# Patient Record
Sex: Female | Born: 1965 | Race: White | Hispanic: No | Marital: Married | Smoking: Former smoker
Health system: Southern US, Community
[De-identification: ages and names within clinical notes are randomized; demographics above are authoritative.]

## PROBLEM LIST (undated history)

## (undated) DIAGNOSIS — E782 Mixed hyperlipidemia: Secondary | ICD-10-CM

## (undated) DIAGNOSIS — N390 Urinary tract infection, site not specified: Secondary | ICD-10-CM

## (undated) DIAGNOSIS — Z9689 Presence of other specified functional implants: Secondary | ICD-10-CM

## (undated) DIAGNOSIS — G894 Chronic pain syndrome: Secondary | ICD-10-CM

## (undated) DIAGNOSIS — Z8709 Personal history of other diseases of the respiratory system: Secondary | ICD-10-CM

## (undated) DIAGNOSIS — J4520 Mild intermittent asthma, uncomplicated: Secondary | ICD-10-CM

## (undated) DIAGNOSIS — G8929 Other chronic pain: Secondary | ICD-10-CM

## (undated) DIAGNOSIS — E785 Hyperlipidemia, unspecified: Secondary | ICD-10-CM

## (undated) DIAGNOSIS — N3010 Interstitial cystitis (chronic) without hematuria: Secondary | ICD-10-CM

## (undated) DIAGNOSIS — F419 Anxiety disorder, unspecified: Secondary | ICD-10-CM

## (undated) DIAGNOSIS — R222 Localized swelling, mass and lump, trunk: Secondary | ICD-10-CM

## (undated) DIAGNOSIS — Z8719 Personal history of other diseases of the digestive system: Secondary | ICD-10-CM

## (undated) DIAGNOSIS — I10 Essential (primary) hypertension: Secondary | ICD-10-CM

## (undated) DIAGNOSIS — K219 Gastro-esophageal reflux disease without esophagitis: Secondary | ICD-10-CM

## (undated) DIAGNOSIS — Z8711 Personal history of peptic ulcer disease: Secondary | ICD-10-CM

## (undated) DIAGNOSIS — E7801 Familial hypercholesterolemia: Secondary | ICD-10-CM

## (undated) DIAGNOSIS — M1990 Unspecified osteoarthritis, unspecified site: Secondary | ICD-10-CM

## (undated) DIAGNOSIS — J45909 Unspecified asthma, uncomplicated: Secondary | ICD-10-CM

## (undated) DIAGNOSIS — I2510 Atherosclerotic heart disease of native coronary artery without angina pectoris: Secondary | ICD-10-CM

## (undated) HISTORY — DX: Essential (primary) hypertension: I10

## (undated) HISTORY — DX: Anxiety disorder, unspecified: F41.9

## (undated) HISTORY — PX: HERNIA REPAIR: SHX51

## (undated) HISTORY — PX: CARPAL TUNNEL RELEASE: SHX101

## (undated) HISTORY — DX: Familial hypercholesterolemia: E78.01

## (undated) HISTORY — PX: SEPTOPLASTY: SUR1290

## (undated) HISTORY — PX: SPINAL CORD STIMULATOR INSERTION: SHX5378

## (undated) HISTORY — DX: Urinary tract infection, site not specified: N39.0

## (undated) HISTORY — PX: ESOPHAGOGASTRODUODENOSCOPY: SHX1529

## (undated) HISTORY — DX: Atherosclerotic heart disease of native coronary artery without angina pectoris: I25.10

## (undated) HISTORY — PX: ANTERIOR FUSION CERVICAL SPINE: SUR626

## (undated) HISTORY — PX: BREAST LUMPECTOMY: SHX2

## (undated) HISTORY — PX: HIP FRACTURE SURGERY: SHX118

## (undated) HISTORY — PX: CYSTOSCOPY WITH HYDRODISTENSION AND BIOPSY: SHX5127

## (undated) HISTORY — PX: COLONOSCOPY: SHX174

## (undated) HISTORY — PX: VOCAL CORD INJECTION: SHX2663

## (undated) HISTORY — DX: Hyperlipidemia, unspecified: E78.5

## (undated) HISTORY — PX: VAGINAL HYSTERECTOMY: SUR661

## (undated) HISTORY — PX: LUMBAR SPINE SURGERY: SHX701

## (undated) HISTORY — DX: Unspecified asthma, uncomplicated: J45.909

## (undated) HISTORY — DX: Interstitial cystitis (chronic) without hematuria: N30.10

## (undated) HISTORY — DX: Unspecified osteoarthritis, unspecified site: M19.90

---

## 1984-08-06 HISTORY — PX: SEPTOPLASTY: SUR1290

## 1987-08-07 HISTORY — PX: VAGINAL HYSTERECTOMY: SUR661

## 1989-08-06 HISTORY — PX: HERNIA REPAIR: SHX51

## 1995-08-07 HISTORY — PX: BREAST LUMPECTOMY: SHX2

## 1996-08-06 HISTORY — PX: LUMBAR FUSION: SHX111

## 1996-08-06 HISTORY — PX: HIP SURGERY: SHX245

## 1999-08-07 HISTORY — PX: CARPAL TUNNEL RELEASE: SHX101

## 2017-08-06 HISTORY — PX: SPINAL CORD STIMULATOR BATTERY EXCHANGE: SHX6202

## 2018-05-16 ENCOUNTER — Encounter (INDEPENDENT_AMBULATORY_CARE_PROVIDER_SITE_OTHER): Payer: BLUE CROSS/BLUE SHIELD | Admitting: Neurology

## 2018-05-16 ENCOUNTER — Ambulatory Visit (INDEPENDENT_AMBULATORY_CARE_PROVIDER_SITE_OTHER): Payer: BLUE CROSS/BLUE SHIELD | Admitting: Neurology

## 2018-05-16 DIAGNOSIS — G5601 Carpal tunnel syndrome, right upper limb: Secondary | ICD-10-CM | POA: Diagnosis not present

## 2018-05-16 DIAGNOSIS — Z0289 Encounter for other administrative examinations: Secondary | ICD-10-CM

## 2018-05-16 NOTE — Procedures (Signed)
Full Name: Alice Ray Gender: Female MRN #: [id number] Date of Birth: 02-23-1966    Visit Date: 05/16/2018 10:53 Age: 52 Years 70 Months Old Examining Physician: Marcial Pacas, MD  Referring Physician: Vivi Ferns, MD History: 52 year old female, with history of right carpal tunnel release surgery, cervical fusion surgery in the past, presenting with few months history of bilateral hand deep achy pain, especially bilateral palm area, right worse than left.  Summary of the tests:  Nerve conduction study: Bilateral ulnar, median sensory responses were normal.  Right median mixed response was 0.6 ms prolonged in comparison to ipsilateral ulnar mixed response.  Bilateral ulnar and median motor responses were normal.  Electromyography: Selective needle examination of right upper extremity and right cervical paraspinal muscles were normal.  Conclusion: This is an abnormal study.  There is electrodiagnostic evidence of mild median neuropathy across the wrist consistent with mild right carpal tunnel syndrome.  There is no evidence of right cervical radiculopathy.    ------------------------------- Marcial Pacas, M.D. PhD.  [facility] [place], [place] [address] Tel: [phone] Fax: [phone]        [facility]    Nerve / Sites Muscle Latency Ref. Amplitude Ref. Rel Amp Segments Distance Velocity Ref. Area    ms ms mV mV %  cm m/s m/s mVms  R Median - APB     Wrist APB 3.1 ?4.4 4.4 ?4.0 100 Wrist - APB 7   15.8     Upper arm APB 7.2  4.4  101 Upper arm - Wrist 23 57 ?49 16.1  L Median - APB     Wrist APB 3.4 ?4.4 7.4 ?4.0 100 Wrist - APB 7   33.0     Upper arm APB 7.6  6.8  91.7 Upper arm - Wrist 23 55 ?49 30.3  R Ulnar - ADM     Wrist ADM 2.6 ?3.3 9.1 ?6.0 100 Wrist - ADM 7   26.0     B.Elbow ADM 5.9  8.5  93.3 B.Elbow - Wrist 20 59 ?49 25.7     A.Elbow ADM 7.8  8.4  99.4 A.Elbow - B.Elbow 10 55 ?49 24.9         A.Elbow - Wrist      L Ulnar  - ADM     Wrist ADM 2.5 ?3.3 9.5 ?6.0 100 Wrist - ADM 7   28.2     B.Elbow ADM 5.9  9.1  96 B.Elbow - Wrist 20 58 ?49 27.9     A.Elbow ADM 7.7  9.0  98.9 A.Elbow - B.Elbow 10 58 ?49 28.0         A.Elbow - Wrist                 SNC    Nerve / Sites Rec. Site Peak Lat Ref.  Amp Ref. Segments Distance Peak Diff Ref.    ms ms V V  cm ms ms  R Median, Ulnar - Transcarpal comparison     Median Palm Wrist 2.4 ?2.2 26 ?35 Median Palm - Wrist 8       Ulnar Palm Wrist 1.8 ?2.2 16 ?12 Ulnar Palm - Wrist 8          Median Palm - Ulnar Palm  0.6 ?0.4  R Median - Orthodromic (Dig II, Mid palm)     Dig II Wrist 3.0 ?3.4 15 ?10 Dig II - Wrist 13    L Median - Orthodromic (  Dig II, Mid palm)     Dig II Wrist 3.4 ?3.4 13 ?10 Dig II - Wrist 13    R Ulnar - Orthodromic, (Dig V, Mid palm)     Dig V Wrist 2.6 ?3.1 7 ?5 Dig V - Wrist 11    L Ulnar - Orthodromic, (Dig V, Mid palm)     Dig V Wrist 2.5 ?3.1 6 ?5 Dig V - Wrist 51                 F  Wave    Nerve F Lat Ref.   ms ms  R Ulnar - ADM 28.8 ?32.0  L Ulnar - ADM 28.1 ?32.0         EMG full       EMG Summary Table    Spontaneous MUAP Recruitment  Muscle IA Fib PSW Fasc Other Amp Dur. Poly Pattern  R. First dorsal interosseous Normal None None None _______ Normal Normal Normal Normal  R. Pronator teres Normal None None None _______ Normal Normal Normal Normal  R. Biceps brachii Normal None None None _______ Normal Normal Normal Normal  R. Deltoid Normal None None None _______ Normal Normal Normal Normal  R. Triceps brachii Normal None None None _______ Normal Normal Normal Normal  R. Extensor digitorum communis Normal None None None _______ Normal Normal Normal Normal

## 2019-09-13 ENCOUNTER — Encounter (HOSPITAL_COMMUNITY): Payer: Self-pay

## 2019-09-13 ENCOUNTER — Emergency Department (HOSPITAL_COMMUNITY): Payer: BC Managed Care – PPO

## 2019-09-13 ENCOUNTER — Other Ambulatory Visit: Payer: Self-pay

## 2019-09-13 ENCOUNTER — Emergency Department (HOSPITAL_COMMUNITY)
Admission: EM | Admit: 2019-09-13 | Discharge: 2019-09-13 | Disposition: A | Discharge status: Home / Self Care | Payer: BC Managed Care – PPO | Attending: Emergency Medicine | Admitting: Emergency Medicine

## 2019-09-13 DIAGNOSIS — K625 Hemorrhage of anus and rectum: Secondary | ICD-10-CM | POA: Diagnosis present

## 2019-09-13 DIAGNOSIS — K529 Noninfective gastroenteritis and colitis, unspecified: Secondary | ICD-10-CM | POA: Diagnosis not present

## 2019-09-13 LAB — CBC WITH DIFFERENTIAL/PLATELET
Abs Immature Granulocytes: 0.02 10*3/uL (ref 0.00–0.07)
Basophils Absolute: 0 10*3/uL (ref 0.0–0.1)
Basophils Relative: 1 %
Eosinophils Absolute: 0.1 10*3/uL (ref 0.0–0.5)
Eosinophils Relative: 1 %
HCT: 42.8 % (ref 36.0–46.0)
Hemoglobin: 14 g/dL (ref 12.0–15.0)
Immature Granulocytes: 0 %
Lymphocytes Relative: 23 %
Lymphs Abs: 1.3 10*3/uL (ref 0.7–4.0)
MCH: 31.2 pg (ref 26.0–34.0)
MCHC: 32.7 g/dL (ref 30.0–36.0)
MCV: 95.3 fL (ref 80.0–100.0)
Monocytes Absolute: 0.4 10*3/uL (ref 0.1–1.0)
Monocytes Relative: 7 %
Neutro Abs: 4.1 10*3/uL (ref 1.7–7.7)
Neutrophils Relative %: 68 %
Platelets: 252 10*3/uL (ref 150–400)
RBC: 4.49 MIL/uL (ref 3.87–5.11)
RDW: 13.3 % (ref 11.5–15.5)
WBC: 5.9 10*3/uL (ref 4.0–10.5)
nRBC: 0 % (ref 0.0–0.2)

## 2019-09-13 LAB — COMPREHENSIVE METABOLIC PANEL
ALT: 25 U/L (ref 0–44)
AST: 22 U/L (ref 15–41)
Albumin: 4.6 g/dL (ref 3.5–5.0)
Alkaline Phosphatase: 53 U/L (ref 38–126)
Anion gap: 9 (ref 5–15)
BUN: 10 mg/dL (ref 6–20)
CO2: 28 mmol/L (ref 22–32)
Calcium: 9.5 mg/dL (ref 8.9–10.3)
Chloride: 102 mmol/L (ref 98–111)
Creatinine, Ser: 0.7 mg/dL (ref 0.44–1.00)
GFR calc Af Amer: >60 mL/min (ref >60)
GFR calc non Af Amer: >60 mL/min (ref >60)
Glucose, Bld: 102 mg/dL — ABNORMAL HIGH (ref 70–99)
Potassium: 3.5 mmol/L (ref 3.5–5.1)
Sodium: 139 mmol/L (ref 135–145)
Total Bilirubin: 0.7 mg/dL (ref 0.3–1.2)
Total Protein: 7.3 g/dL (ref 6.5–8.1)

## 2019-09-13 LAB — LIPASE, BLOOD: Lipase: 21 U/L (ref 11–51)

## 2019-09-13 LAB — POC OCCULT BLOOD, ED: Fecal Occult Bld: POSITIVE — AB

## 2019-09-13 LAB — PROTIME-INR
INR: 1 (ref 0.8–1.2)
Prothrombin Time: 12.6 seconds (ref 11.4–15.2)

## 2019-09-13 MED ORDER — METRONIDAZOLE 500 MG PO TABS: 500.0000 mg | ORAL_TABLET | Freq: Two times a day (BID) | ORAL | 0 refills | Status: DC

## 2019-09-13 MED ORDER — IOHEXOL 350 MG/ML SOLN
100.0000 mL | Freq: Once | INTRAVENOUS | Status: AC | PRN
  Administered 2019-09-13: 100 mL via INTRAVENOUS

## 2019-09-13 MED ORDER — SODIUM CHLORIDE (PF) 0.9 % IJ SOLN
INTRAMUSCULAR | Status: AC
  Filled 2019-09-13: qty 50

## 2019-09-13 MED ORDER — SODIUM CHLORIDE 0.9 % IV BOLUS
1000.0000 mL | Freq: Once | INTRAVENOUS | Status: AC
  Administered 2019-09-13: 1000 mL via INTRAVENOUS

## 2019-09-13 MED ORDER — METRONIDAZOLE 500 MG PO TABS
500.0000 mg | ORAL_TABLET | Freq: Once | ORAL | Status: AC
  Administered 2019-09-13: 500 mg via ORAL
  Filled 2019-09-13: qty 1

## 2019-09-13 MED ORDER — OXYCODONE-ACETAMINOPHEN 5-325 MG PO TABS
2.0000 | ORAL_TABLET | Freq: Once | ORAL | Status: AC
  Administered 2019-09-13: 2 via ORAL
  Filled 2019-09-13: qty 2

## 2019-09-13 MED ORDER — CIPROFLOXACIN HCL 500 MG PO TABS
500.0000 mg | ORAL_TABLET | Freq: Once | ORAL | Status: AC
  Administered 2019-09-13: 500 mg via ORAL
  Filled 2019-09-13: qty 1

## 2019-09-13 MED ORDER — CIPROFLOXACIN HCL 500 MG PO TABS: 500.0000 mg | ORAL_TABLET | Freq: Two times a day (BID) | ORAL | 0 refills | Status: DC

## 2019-09-13 MED ORDER — MORPHINE SULFATE (PF) 4 MG/ML IV SOLN
4.0000 mg | Freq: Once | INTRAVENOUS | Status: AC
  Administered 2019-09-13: 4 mg via INTRAVENOUS
  Filled 2019-09-13: qty 1

## 2019-09-13 NOTE — ED Provider Notes (Signed)
[facility] DEPT Provider Note   CSN: [id number] Arrival date & time: 09/13/19  1512     History Chief Complaint  Patient presents with  . Rectal Bleeding    Alice Ray is a 54 y.o. female.  She has no significant past medical history.  She is complaining of acute onset at 2 AM of some abdominal discomfort and she went to the toilet where she had an explosive stool.  She had a syncopal event after that.  She is not sure if there was any bleeding related to that but since this morning she has been passing multiple episodes of bright red blood per rectum.  Associated with some lower abdominal pain.  No fevers or chills no nausea no vomiting.  She is very diaphoretic prior to the syncopal event.  No sick contacts or recent travel.  Works on a farm.  She said she has had some GI issues in the past that they thought was related to Salmonella although has not had any problems in the last few months and it was not involving any bleeding.  Had a normal colonoscopy in the past.  The history is provided by the patient.  Rectal Bleeding Quality:  Bright red Amount:  Moderate Duration:  12 hours Timing:  Intermittent Chronicity:  New Context: defecation and spontaneously   Similar prior episodes: no   Relieved by:  Nothing Worsened by:  Defecation Ineffective treatments:  None tried Associated symptoms: abdominal pain and loss of consciousness   Associated symptoms: no fever, no hematemesis and no vomiting   Risk factors: no anticoagulant use        History reviewed. No pertinent past medical history.  There are no problems to display for this patient.   History reviewed. No pertinent surgical history.   OB History   No obstetric history on file.     No family history on file.  Social History   Tobacco Use  . Smoking status: Never Smoker  . Smokeless tobacco: Never Used  Substance Use Topics  . Alcohol use: Never  . Drug use: Never     Home Medications Prior to Admission medications   Not on File    Allergies    Patient has no known allergies.  Review of Systems   Review of Systems  Constitutional: Negative for fever.  HENT: Negative for sore throat.   Eyes: Negative for visual disturbance.  Respiratory: Negative for shortness of breath.   Cardiovascular: Negative for chest pain.  Gastrointestinal: Positive for abdominal pain and hematochezia. Negative for hematemesis and vomiting.  Genitourinary: Negative for dysuria.  Musculoskeletal: Negative for neck pain.  Skin: Negative for rash.  Neurological: Positive for loss of consciousness and syncope.    Physical Exam Updated Vital Signs BP (!) 131/96 (BP Location: Right Arm)   Pulse (!) 109   Temp 98.3 F (36.8 C) (Oral)   Resp 18   SpO2 98%   Physical Exam Vitals and nursing note reviewed.  Constitutional:      General: She is not in acute distress.    Appearance: She is well-developed.  HENT:     Head: Normocephalic and atraumatic.  Eyes:     Conjunctiva/sclera: Conjunctivae normal.  Cardiovascular:     Rate and Rhythm: Regular rhythm. Tachycardia present.     Pulses: Normal pulses.     Heart sounds: No murmur.  Pulmonary:     Effort: Pulmonary effort is normal. No respiratory distress.     Breath  sounds: Normal breath sounds.  Abdominal:     Palpations: Abdomen is soft.     Tenderness: There is no abdominal tenderness. There is no guarding or rebound.  Musculoskeletal:        General: No deformity or signs of injury. Normal range of motion.     Cervical back: Neck supple.  Skin:    General: Skin is warm and dry.     Capillary Refill: Capillary refill takes less than 2 seconds.  Neurological:     General: No focal deficit present.     Mental Status: She is alert.     ED Results / Procedures / Treatments   Labs (all labs ordered are listed, but only abnormal results are displayed) Labs Reviewed  COMPREHENSIVE METABOLIC PANEL -  Abnormal; Notable for the following components:      Result Value   Glucose, Bld 102 (*)    All other components within normal limits  POC OCCULT BLOOD, ED - Abnormal; Notable for the following components:   Fecal Occult Bld POSITIVE (*)    All other components within normal limits  GI PATHOGEN PANEL BY PCR, STOOL  CBC WITH DIFFERENTIAL/PLATELET  PROTIME-INR  LIPASE, BLOOD  TYPE AND SCREEN  ABO/RH    EKG None  Radiology CT Angio Abd/Pel W and/or Wo Contrast  Result Date: 09/13/2019 CLINICAL DATA:  54 year old with diarrhea at approximately 2 o'clock a.m. this morning, with subsequent bright red blood per rectum since that time. Personal history of salmonella colitis. EXAM: CTA ABDOMEN AND PELVIS WITHOUT AND WITH CONTRAST TECHNIQUE: Initially, multidetector CT of the abdomen and pelvis was performed prior to IV contrast administration. Multidetector CT imaging of the abdomen and pelvis was then performed using the standard protocol during bolus administration of intravenous contrast. Multiplanar reconstructed images and MIPs were obtained and reviewed to evaluate the vascular anatomy. CONTRAST:  167mL OMNIPAQUE IOHEXOL 350 MG/ML IV. COMPARISON:  None. FINDINGS: VASCULAR Aorta: Mild to moderate atherosclerosis. No evidence of aortic aneurysm. Celiac: Widely patent. No visible atherosclerosis. SMA: Widely patent. No visible atherosclerosis. Renals: Single RIGHT renal artery, widely patent, with minimal atherosclerosis at its origin. Two LEFT renal arteries, including an accessory LOWER pole LEFT renal artery which arises from the ANTERIOR aorta distally, both widely patent without significant atherosclerosis. IMA: Patent with atherosclerosis at its origin and possible origin stenosis. Inflow: Moderate atherosclerosis involving the common iliac arteries and mild atherosclerosis involving the external iliac arteries without evidence of significant stenosis. Proximal Outflow: Mild atherosclerosis  involving the superficial femoral arteries bilaterally without evidence of significant stenosis. Veins: Normal-appearing portal venous and systemic venous systems. Review of the MIP images confirms the above findings. NON-VASCULAR Lower chest: Visualized lung bases clear. Upper normal heart size. Hepatobiliary: Prominent Reidel lobe. Borderline hepatomegaly. No focal hepatic parenchymal abnormalities. Gallbladder normal in appearance without calcified gallstones. No biliary ductal dilation. Pancreas: Normal in appearance without evidence of mass, ductal dilation, or inflammation. Spleen: Normal in size and appearance. Adrenals/Urinary Tract: Normal appearing adrenal glands. Kidneys normal in size and appearance without focal parenchymal abnormality. No hydronephrosis. No evidence of urinary tract calculi. Normal appearing urinary bladder. Stomach/Bowel: Stomach normal in appearance for the degree of distention. Normal-appearing small bowel. Wall thickening/edema involving the descending colon. Remainder of the colon normal in appearance. Appendix not conspicuous, but no pericecal inflammation. Lymphatic: No pathologic lymphadenopathy. Reproductive: Uterus presumed surgically absent. No adnexal masses. Other: Numerous pelvic phleboliths. Battery generator packs for what I presume are dorsal cord stimulating devices are present in the  buttocks. Musculoskeletal: Prior posterior decompression and ray cage fusion at L5-S1. No acute findings. IMPRESSION: VASCULAR 1. No evidence of abdominal aortic aneurysm or dissection. 2. 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 The celiac and SMA are widely patent. The IMA is patent with possible origin stenosis. Therefore, no evidence of large vessel mesenteric ischemia. 3. 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 4. Single RIGHT renal artery and 2 LEFT renal arteries which are widely patent. NON-VASCULAR 1. Wall  thickening/edema involving the descending colon indicating colitis. 2. No acute abnormalities otherwise involving the abdomen or pelvis. Electronically Signed   By: Evangeline Dakin M.D.   On: 09/13/2019 20:36    Procedures Procedures (including critical care time)  Medications Ordered in ED Medications  sodium chloride 0.9 % bolus 1,000 mL (0 mLs Intravenous Stopped 09/13/19 1954)  morphine 4 MG/ML injection 4 mg (4 mg Intravenous Given 09/13/19 1805)  iohexol (OMNIPAQUE) 350 MG/ML injection 100 mL (100 mLs Intravenous Contrast Given 09/13/19 1940)  ciprofloxacin (CIPRO) tablet 500 mg (500 mg Oral Given 09/13/19 2130)  metroNIDAZOLE (FLAGYL) tablet 500 mg (500 mg Oral Given 09/13/19 2130)  oxyCODONE-acetaminophen (PERCOCET/ROXICET) 5-325 MG per tablet 2 tablet (2 tablets Oral Given 09/13/19 2129)    ED Course  I have reviewed the triage vital signs and the nursing notes.  Pertinent labs & imaging results that were available during my care of the patient were reviewed by me and considered in my medical decision making (see chart for details).  Clinical Course as of Sep 13 841  Sun Sep 13, 2019  1757 Differential includes diverticular bleed, diverticulitis, AVM, infectious diarrhea.   [MB]  [id number] Rectal exam done with Wells Guiles nurse as chaperone.  Small anal tag no masses normal tone.  Sample sent to lab for guaiac.   [MB]  2119 Discussed with Dr. Tarri Glenn [facility].  She is recommending the patient go on Cipro and Flagyl and can follow-up with them in the office.  I reviewed this with the patient and she is comfortable with plan.  He is asking for another dose of some pain medicine here but does not feel like she will need it going home.  She did not provide a stool sample unfortunately.   [MB]    Clinical Course User Index [MB] Hayden Rasmussen, MD   MDM Rules/Calculators/A&P                       Final Clinical Impression(s) / ED Diagnoses Final diagnoses:  Rectal bleeding    Colitis    Rx / DC Orders ED Discharge Orders         Ordered    ciprofloxacin (CIPRO) 500 MG tablet  2 times daily     09/13/19 2122    metroNIDAZOLE (FLAGYL) 500 MG tablet  2 times daily     09/13/19 2122           Hayden Rasmussen, MD 09/14/19 [phone]

## 2019-09-13 NOTE — ED Triage Notes (Signed)
Pt presents with c/o rectal bleeding. Pt reports that she has a hx of some GI issues and a hx of salmonella. Pt reports that she has had no issues and then woke up around 2 am and had an episode of diarrhea. Pt reports that since then, she has been passing blood from her rectum. 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 Pt reports that she did have a vasovagal episode with the diarrhea and passed out, waking up on the floor. Pt denies any injury from that fall. Pt reports she must have hit her head because she did pass out but notes no injuries to her head. Pt is alert and oriented at this time.

## 2019-09-13 NOTE — Discharge Instructions (Addendum)
You were evaluated in the emergency department for abdominal pain and rectal bleeding.  Your blood counts were unremarkable.  Your CAT scan showed some thickening of your colon which is called colitis.  We reviewed your findings with gastroenterology and they are recommending to antibiotics and follow-up with them in the clinic.  Please return to the emergency department if any worsening bleeding or other concerning symptoms.

## 2019-09-13 NOTE — ED Notes (Signed)
An After Visit Summary was printed and given to the patient. Discharge instructions given and no further questions at this time. Pt states her husband is driving her home.

## 2019-09-14 LAB — ABO/RH: ABO/RH(D): O POS

## 2019-09-14 LAB — TYPE AND SCREEN
ABO/RH(D): O POS
Antibody Screen: NEGATIVE

## 2019-09-18 ENCOUNTER — Other Ambulatory Visit (INDEPENDENT_AMBULATORY_CARE_PROVIDER_SITE_OTHER): Payer: BC Managed Care – PPO

## 2019-09-18 ENCOUNTER — Ambulatory Visit (INDEPENDENT_AMBULATORY_CARE_PROVIDER_SITE_OTHER): Payer: BC Managed Care – PPO | Admitting: Physician Assistant

## 2019-09-18 ENCOUNTER — Encounter: Payer: Self-pay | Admitting: Physician Assistant

## 2019-09-18 ENCOUNTER — Other Ambulatory Visit: Payer: Self-pay

## 2019-09-18 VITALS — BP 108/66 | HR 64 | Temp 97.6°F | Ht 68.5 in | Wt 146.0 lb

## 2019-09-18 DIAGNOSIS — R197 Diarrhea, unspecified: Secondary | ICD-10-CM

## 2019-09-18 DIAGNOSIS — R9389 Abnormal findings on diagnostic imaging of other specified body structures: Secondary | ICD-10-CM | POA: Diagnosis not present

## 2019-09-18 DIAGNOSIS — R1084 Generalized abdominal pain: Secondary | ICD-10-CM | POA: Diagnosis not present

## 2019-09-18 DIAGNOSIS — K921 Melena: Secondary | ICD-10-CM

## 2019-09-18 DIAGNOSIS — B370 Candidal stomatitis: Secondary | ICD-10-CM

## 2019-09-18 LAB — CBC WITH DIFFERENTIAL/PLATELET
Basophils Absolute: 0 10*3/uL (ref 0.0–0.1)
Basophils Relative: 0.2 % (ref 0.0–3.0)
Eosinophils Absolute: 0.1 10*3/uL (ref 0.0–0.7)
Eosinophils Relative: 1.9 % (ref 0.0–5.0)
HCT: 40.5 % (ref 36.0–46.0)
Hemoglobin: 13.5 g/dL (ref 12.0–15.0)
Lymphocytes Relative: 35.2 % (ref 12.0–46.0)
Lymphs Abs: 1.8 10*3/uL (ref 0.7–4.0)
MCHC: 33.3 g/dL (ref 30.0–36.0)
MCV: 92.8 fl (ref 78.0–100.0)
Monocytes Absolute: 0.4 10*3/uL (ref 0.1–1.0)
Monocytes Relative: 8.2 % (ref 3.0–12.0)
Neutro Abs: 2.9 10*3/uL (ref 1.4–7.7)
Neutrophils Relative %: 54.5 % (ref 43.0–77.0)
Platelets: 326 10*3/uL (ref 150.0–400.0)
RBC: 4.36 Mil/uL (ref 3.87–5.11)
RDW: 13.6 % (ref 11.5–15.5)
WBC: 5.2 10*3/uL (ref 4.0–10.5)

## 2019-09-18 LAB — COMPREHENSIVE METABOLIC PANEL
ALT: 21 U/L (ref 0–35)
AST: 20 U/L (ref 0–37)
Albumin: 4.5 g/dL (ref 3.5–5.2)
Alkaline Phosphatase: 58 U/L (ref 39–117)
BUN: 11 mg/dL (ref 6–23)
CO2: 31 mEq/L (ref 19–32)
Calcium: 10.2 mg/dL (ref 8.4–10.5)
Chloride: 101 mEq/L (ref 96–112)
Creatinine, Ser: 0.84 mg/dL (ref 0.40–1.20)
GFR: 70.53 mL/min (ref >60.00)
Glucose, Bld: 97 mg/dL (ref 70–99)
Potassium: 4.5 mEq/L (ref 3.5–5.1)
Sodium: 139 mEq/L (ref 135–145)
Total Bilirubin: 0.4 mg/dL (ref 0.2–1.2)
Total Protein: 7.4 g/dL (ref 6.0–8.3)

## 2019-09-18 MED ORDER — FLUCONAZOLE 100 MG PO TABS: 100.0000 mg | ORAL_TABLET | Freq: Every day | ORAL | 0 refills | Status: AC

## 2019-09-18 NOTE — Patient Instructions (Signed)
If you are age 55 or older, your body mass index should be between 23-30. Your Body mass index is 21.88 kg/m. If this is out of the aforementioned range listed, please consider follow up with your Primary Care Provider.  If you are age 88 or younger, your body mass index should be between 19-25. Your Body mass index is 21.88 kg/m. If this is out of the aformentioned range listed, please consider follow up with your Primary Care Provider.   Your provider has requested that you go to the basement level for lab work before leaving today. Press "B" on the elevator. The lab is located at the first door on the left as you exit the elevator.  We have sent the following medications to your pharmacy for you to pick up at your convenience: Diflucan  Finish antibiotics.  You have been given Low fiber, low residue diet.  We will contact you with lab results.  Thank you for choosing me and [facility].    Ellouise Newer, PA-C  Due to recent changes in healthcare laws, you may see the results of your imaging and laboratory studies on MyChart before your provider has had a chance to review them.  We understand that in some cases there may be results that are confusing or concerning to you. Not all laboratory results come back in the same time frame and the provider may be waiting for multiple results in order to interpret others.  Please give [place] 48 hours in order for your provider to thoroughly review all the results before contacting the office for clarification of your results.

## 2019-09-18 NOTE — Progress Notes (Signed)
Chief Complaint: ER follow-up for rectal bleeding  HPI:    Alice Ray is a 54 year old female with a past medical history as listed below, who was referred to me by Joya Gaskins, * for follow-up after being seen in the ER for rectal bleeding.      09/13/2019 patient seen in the ER for rectal bleeding.  At that time described acute onset at 2 AM of abdominal discomfort and went to the toilet and had an explosive stool with a syncopal episode after that, then passed multiple episodes of bright red blood per rectum associated lower abdominal pain.  Labs at that time with a normal CBC and CMP, stool occult positive.  CT abdomen pelvis with and without contrast showed possible origins stenosis involving the inferior mesenteric artery.  The celiac and SMA were widely patent.  The IMA was patent with possible origin stenosis.  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  Wall thickening/edema involving the descending colon indicating colitis.  Rectal exam done with a small anal tag no masses, normal tone.  It was recommend the patient be started on Cipro and Flagyl.    Today, the patient presents to clinic and explains that she has been training to climb [place] [facility] for the past 4 months and did a 6 mile, 6 mile and then 7 mile training session and then woke up around 2 AM on 09/13/2019 with horrendous 10/10 abdominal pain which left her with sweat dripping off of her face by the time she got to her toilet which is located in her bedroom, she then passed out and started shedding a lot of blood per rectum.  This was bright red blood and happened multiple times before she went to the ER.  While in the ER she did not pass blood but as soon as she came home she passed more blood.  Tells me that since then she has continued with some abdominal pain though this is much better than when it started it seems to come and go and "roller coaster" in intensity.  She just started  having actual bowel movements yesterday with stool and tells me that they were urgent, had at least 5 yesterday and 3 this morning so far, the blood itself has "slowed way down", in fact she has not seeing any obvious bleeding over the past day and a half.  Associated symptoms include nausea.  Does continue on her Flagyl and Cipro.  Overall feels like she is improving.  Associated symptoms include some chills.  Does tell me that she has been continuing on what sounds like a high-fiber diet and asked what she should be eating.    Also tells me that she was having some GI issues about 4 months ago for which she was seen by her PCP including a lot of diarrhea and abdominal cramping but no bleeding.  She was told that this is because she recently moved here from [place] and did not have the same "microbiome" and was just adjusting.    Does tell me she is getting some thrush in her mouth and would like some Diflucan.    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    Medical  history also significant for having what sounds like a spinal stimulator in place to help with pain after falling off a ledge and breaking multiple bones in the past. This is the first of it's kind per the patient, so she is "kinda famous".    Patient sister works as a Marine scientist for what sounds like an IT consultant and asked if she was Covid tested.    Last colonoscopy 2 years ago in [place].  Patient tells me this was normal.    Denies documented fever or weight loss.  Past Medical History:  Diagnosis Date  . Anxiety   . Arthritis   . Asthma   . Hyperlipidemia   . Hypertension   . Interstitial cystitis   . UTI (urinary tract infection)     Past Surgical History:  Procedure  Laterality Date  . ANTERIOR FUSION CERVICAL SPINE    . BREAST LUMPECTOMY Right   . CARPAL TUNNEL RELEASE    . CYSTOSCOPY WITH HYDRODISTENSION AND BIOPSY    . HERNIA REPAIR     Rectus Hernia split repair  . HIP FRACTURE SURGERY    . LUMBAR SPINE SURGERY    . SEPTOPLASTY    . VAGINAL HYSTERECTOMY    . VOCAL CORD INJECTION      Current Outpatient Medications  Medication Sig Dispense Refill  . albuterol (VENTOLIN HFA) 108 (90 Base) MCG/ACT inhaler Inhale 2 puffs into the lungs as needed for wheezing or shortness of breath.    . ciprofloxacin (CIPRO) 500 MG tablet Take 1 tablet (500 mg total) by mouth 2 (two) times daily. 14 tablet 0  . diclofenac Sodium (VOLTAREN) 1 % GEL Apply 1 application topically 4 (four) times daily as needed (joint pain).     Marland Kitchen EPINEPHrine 0.3 mg/0.3 mL IJ SOAJ injection Inject 0.3 mg into the muscle Once PRN for anaphylaxis.    Marland Kitchen LORazepam (ATIVAN) 1 MG tablet Take 1 mg by mouth daily as needed for anxiety.     Marland Kitchen losartan (COZAAR) 100 MG tablet Take 50 mg by mouth daily.    . metoprolol succinate (TOPROL-XL) 25 MG 24 hr tablet Take 25 mg by mouth every evening.    . metroNIDAZOLE (FLAGYL) 500 MG tablet Take 1 tablet (500 mg total) by mouth 2 (two) times daily. 14 tablet 0  . montelukast (SINGULAIR) 10 MG tablet Take 10 mg by mouth daily as needed (allergies, asthma).     . pantoprazole (PROTONIX) 40 MG tablet Take 40 mg by mouth every morning.    . tiaGABine (GABITRIL) 4 MG tablet Take 4 mg by mouth at bedtime.     No current facility-administered medications for this visit.    Allergies as of 09/18/2019 - Review Complete 09/18/2019  Allergen Reaction Noted  . Penicillins Anaphylaxis 05/10/2016    Family History  Problem Relation Age of Onset  . Colitis Father   . Heart disease Father   . Diverticulosis Sister   . Colitis Paternal Aunt     Social History   Socioeconomic History  . Marital status: Married    Spouse name: Not on file  . Number of  children: Not on file  . Years of education: Not on file  . Highest education level: Not on file  Occupational History  . Not on file  Tobacco Use  . Smoking status: Former Research scientist (life sciences)  . Smokeless tobacco: Never Used  Substance and Sexual Activity  . Alcohol use: Never  . Drug use: Never  . Sexual activity:  Not on file  Other Topics Concern  . Not on file  Social History Narrative  . Not on file   Social Determinants of Health   Financial Resource Strain:   . Difficulty of Paying Living Expenses: Not on file  Food Insecurity:   . Worried About Charity fundraiser in the Last Year: Not on file  . Ran Out of Food in the Last Year: Not on file  Transportation Needs:   . Lack of Transportation (Medical): Not on file  . Lack of Transportation (Non-Medical): Not on file  Physical Activity:   . Days of Exercise per Week: Not on file  . Minutes of Exercise per Session: Not on file  Stress:   . Feeling of Stress : Not on file  Social Connections:   . Frequency of Communication with Friends and Family: Not on file  . Frequency of Social Gatherings with Friends and Family: Not on file  . Attends Religious Services: Not on file  . Active Member of Clubs or Organizations: Not on file  . Attends Archivist Meetings: Not on file  . Marital Status: Not on file  Intimate Partner Violence:   . Fear of Current or Ex-Partner: Not on file  . Emotionally Abused: Not on file  . Physically Abused: Not on file  . Sexually Abused: Not on file    Review of Systems:    Constitutional: No weight loss, fever or chills Skin: No rash  Cardiovascular: No chest pain Respiratory: No SOB  Gastrointestinal: See HPI and otherwise negative Genitourinary: No dysuria  Neurological:+syncope Musculoskeletal: No new muscle or joint pain Hematologic: No bruising Psychiatric: No history of depression or anxiety   Physical Exam:  Vital signs: BP 108/66   Pulse 64   Temp 97.6 F (36.4 C)   Ht  5' 8.5" (1.74 m)   Wt 146 lb (66.2 kg)   BMI 21.88 kg/m   Constitutional:   Very Pleasant Caucasian female appears to be in NAD, Well developed, Well nourished, alert and cooperative Head:  Normocephalic and atraumatic. Eyes:   PEERL, EOMI. No icterus. Conjunctiva pink. Ears:  Normal auditory acuity. Neck:  Supple Throat: Oral cavity and pharynx without inflammation, swelling or lesion.  Respiratory: Respirations even and unlabored. Lungs clear to auscultation bilaterally.   No wheezes, crackles, or rhonchi.  Cardiovascular: Normal S1, S2. No MRG. Regular rate and rhythm. No peripheral edema, cyanosis or pallor.  Gastrointestinal:  Soft, nondistended, moderate generalized ttp with some involuntary guarding. Normal bowel sounds. No appreciable masses or hepatomegaly. Rectal:  Not performed.   Msk:  Symmetrical without gross deformities. Without edema, no deformity or joint abnormality.  Neurologic:  Alert and  oriented x4;  grossly normal neurologically.  Skin:   Dry and intact without significant lesions or rashes. Psychiatric: Demonstrates good judgement and reason without abnormal affect or behaviors.  RELEVANT LABS AND IMAGING: CBC    Component Value Date/Time   WBC 5.9 09/13/2019 1726   RBC 4.49 09/13/2019 1726   HGB 14.0 09/13/2019 1726   HCT 42.8 09/13/2019 1726   PLT 252 09/13/2019 1726   MCV 95.3 09/13/2019 1726   MCH 31.2 09/13/2019 1726   MCHC 32.7 09/13/2019 1726   RDW 13.3 09/13/2019 1726   LYMPHSABS 1.3 09/13/2019 1726   MONOABS 0.4 09/13/2019 1726   EOSABS 0.1 09/13/2019 1726   BASOSABS 0.0 09/13/2019 1726    CMP     Component Value Date/Time   NA 139 09/13/2019 1726  K 3.5 09/13/2019 1726   CL 102 09/13/2019 1726   CO2 28 09/13/2019 1726   GLUCOSE 102 (H) 09/13/2019 1726   BUN 10 09/13/2019 1726   CREATININE 0.70 09/13/2019 1726   CALCIUM 9.5 09/13/2019 1726   PROT 7.3 09/13/2019 1726   ALBUMIN 4.6 09/13/2019 1726   AST 22 09/13/2019 1726   ALT 25  09/13/2019 1726   ALKPHOS 53 09/13/2019 1726   BILITOT 0.7 09/13/2019 1726   GFRNONAA >60 09/13/2019 1726   GFRAA >60 09/13/2019 1726   CLINICAL DATA:  54 year old with diarrhea at approximately 2 o'clock a.m. this morning, with subsequent bright red blood per rectum since that time. Personal history of salmonella colitis.  EXAM: CTA ABDOMEN AND PELVIS WITHOUT AND WITH CONTRAST  TECHNIQUE: Initially, multidetector CT of the abdomen and pelvis was performed prior to IV contrast administration. Multidetector CT imaging of the abdomen and pelvis was then performed using the standard protocol during bolus administration of intravenous contrast. Multiplanar reconstructed images and MIPs were obtained and reviewed to evaluate the vascular anatomy.  CONTRAST:  16mL OMNIPAQUE IOHEXOL 350 MG/ML IV.  COMPARISON:  None.  FINDINGS: VASCULAR  Aorta: Mild to moderate atherosclerosis. No evidence of aortic aneurysm.  Celiac: Widely patent. No visible atherosclerosis.  SMA: Widely patent. No visible atherosclerosis.  Renals: Single RIGHT renal artery, widely patent, with minimal atherosclerosis at its origin. Two LEFT renal arteries, including an accessory LOWER pole LEFT renal artery which arises from the ANTERIOR aorta distally, both widely patent without significant atherosclerosis.  IMA: Patent with atherosclerosis at its origin and possible origin stenosis.  Inflow: Moderate atherosclerosis involving the common iliac arteries and mild atherosclerosis involving the external iliac arteries without evidence of significant stenosis.  Proximal Outflow: Mild atherosclerosis involving the superficial femoral arteries bilaterally without evidence of significant stenosis.  Veins: Normal-appearing portal venous and systemic venous systems.  Review of the MIP images confirms the above findings.  NON-VASCULAR  Lower chest: Visualized lung bases clear. Upper normal  heart size.  Hepatobiliary: Prominent Reidel lobe. Borderline hepatomegaly. No focal hepatic parenchymal abnormalities. Gallbladder normal in appearance without calcified gallstones. No biliary ductal dilation.  Pancreas: Normal in appearance without evidence of mass, ductal dilation, or inflammation.  Spleen: Normal in size and appearance.  Adrenals/Urinary Tract: Normal appearing adrenal glands. Kidneys normal in size and appearance without focal parenchymal abnormality. No hydronephrosis. No evidence of urinary tract calculi. Normal appearing urinary bladder.  Stomach/Bowel: Stomach normal in appearance for the degree of distention. Normal-appearing small bowel. Wall thickening/edema involving the descending colon. Remainder of the colon normal in appearance. Appendix not conspicuous, but no pericecal inflammation.  Lymphatic: No pathologic lymphadenopathy.  Reproductive: Uterus presumed surgically absent. No adnexal masses.  Other: Numerous pelvic phleboliths. Battery generator packs for what I presume are dorsal cord stimulating devices are present in the buttocks.  Musculoskeletal: Prior posterior decompression and ray cage fusion at L5-S1. No acute findings.  IMPRESSION: VASCULAR  1. No evidence of abdominal aortic aneurysm or dissection. 2. 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 The celiac and SMA are widely patent. The IMA is patent with possible origin stenosis. Therefore, no evidence of large vessel mesenteric ischemia. 3. 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 4. Single RIGHT renal artery and 2 LEFT renal arteries which are widely patent.  NON-VASCULAR  1. Wall thickening/edema involving the descending colon indicating colitis. 2. No acute abnormalities otherwise involving the abdomen or pelvis.   Electronically Signed   By: Marcello Moores  Lawrence M.D.   On: 09/13/2019  20:36   Assessment: 1.  Generalized abdominal cramping: Likely with colitis, question ischemic versus infectious versus other 2.  Hematochezia: With above, now improving 3.  CT showing colitis 4.  Diarrhea 5.  Oral thrush: From recent antibiotic use  Plan: 1.  Prescribed Diflucan 100 mg p.o. daily x7 days for oral thrush.  #7 2.  Would recommend the patient maintain a low fiber/ low residue diet for the next week/while continuing with pain.  Provided her with a handout. 3.  Recommend the patient finish her antibiotics, they should be done in the next 2 days. 4.  Ordered stool studies to include GI pathogen panel and O&P.  Did discuss that the this may be somewhat tainted due to recent antibiotic use. 5.  Ordered a repeat CBC and CMP today.  Also ordered a Covid test per patient request. 6.  Discussed with the patient that pending symptoms in the future she may benefit from a colonoscopy, would recommend this is at least 4-6 weeks out from now if possible. If no improvement though, may need to consider sooner. 7.  I will have my nurse call and check on the patient next week to see how she is doing. 8.  Patient to follow in clinic with [place] per recommendations after labs above.  She was assigned to Dr. Rush Landmark today.  Ellouise Newer, PA-C [facility] 09/18/2019, 10:16 AM  Cc: Joya Gaskins, *

## 2019-09-19 NOTE — Progress Notes (Signed)
Attending Physician's Attestation   I have reviewed the chart.   I agree with the Advanced Practitioner's note, impression, and recommendations with any updates as below.  Rule out infectious etiologies although patient has already been on antibiotics for a few days.  If stagnation in improvement overall, then proceed with an earlier colonoscopy otherwise agree for colonoscopy in approximately 4 to 6 weeks.  Justice Britain, MD [facility] Office # [id number]

## 2019-09-25 ENCOUNTER — Other Ambulatory Visit: Payer: Self-pay

## 2019-09-25 LAB — GASTROINTESTINAL PATHOGEN PANEL PCR
C. difficile Tox A/B, PCR: NOT DETECTED
Campylobacter, PCR: NOT DETECTED
Cryptosporidium, PCR: NOT DETECTED
E coli (ETEC) LT/ST PCR: NOT DETECTED
E coli (STEC) stx1/stx2, PCR: NOT DETECTED
E coli 0157, PCR: NOT DETECTED
Giardia lamblia, PCR: NOT DETECTED
Norovirus, PCR: NOT DETECTED
Rotavirus A, PCR: NOT DETECTED
Salmonella, PCR: NOT DETECTED
Shigella, PCR: NOT DETECTED

## 2019-09-25 LAB — OVA AND PARASITE EXAMINATION
CONCENTRATE RESULT:: NONE SEEN
MICRO NUMBER:: 10146888
SPECIMEN QUALITY:: ADEQUATE
TRICHROME RESULT:: NONE SEEN

## 2019-10-06 ENCOUNTER — Other Ambulatory Visit: Payer: Self-pay

## 2019-10-06 DIAGNOSIS — R1084 Generalized abdominal pain: Secondary | ICD-10-CM

## 2019-10-06 DIAGNOSIS — R9389 Abnormal findings on diagnostic imaging of other specified body structures: Secondary | ICD-10-CM

## 2019-10-16 ENCOUNTER — Ambulatory Visit (HOSPITAL_COMMUNITY)
Admission: RE | Admit: 2019-10-16 | Discharge: 2019-10-16 | Disposition: A | Discharge status: Home / Self Care | Payer: BC Managed Care – PPO | Source: Ambulatory Visit | Attending: Gastroenterology | Admitting: Gastroenterology

## 2019-10-16 ENCOUNTER — Other Ambulatory Visit: Payer: Self-pay

## 2019-10-16 DIAGNOSIS — R1084 Generalized abdominal pain: Secondary | ICD-10-CM | POA: Insufficient documentation

## 2019-10-16 DIAGNOSIS — R9389 Abnormal findings on diagnostic imaging of other specified body structures: Secondary | ICD-10-CM

## 2019-10-16 MED ORDER — IOHEXOL 300 MG/ML  SOLN
100.0000 mL | Freq: Once | INTRAMUSCULAR | Status: AC | PRN
  Administered 2019-10-16: 100 mL via INTRAVENOUS

## 2019-10-16 MED ORDER — SODIUM CHLORIDE (PF) 0.9 % IJ SOLN
INTRAMUSCULAR | Status: AC
  Filled 2019-10-16: qty 50

## 2019-10-20 ENCOUNTER — Telehealth: Payer: Self-pay

## 2019-10-20 NOTE — Telephone Encounter (Signed)
Faxed CT results from 10/16/19 to Alice Pace NP at [phone], per patient request

## 2019-10-29 ENCOUNTER — Telehealth: Payer: Self-pay

## 2019-10-29 NOTE — Telephone Encounter (Signed)
NOTES ON FILE FROM [facility] [phone], SENT REFERRAL TO SCHEDULING °

## 2019-11-13 ENCOUNTER — Encounter: Payer: Self-pay | Admitting: Cardiovascular Disease

## 2019-11-13 ENCOUNTER — Other Ambulatory Visit: Payer: Self-pay

## 2019-11-13 ENCOUNTER — Ambulatory Visit (INDEPENDENT_AMBULATORY_CARE_PROVIDER_SITE_OTHER): Payer: BC Managed Care – PPO | Admitting: Cardiovascular Disease

## 2019-11-13 VITALS — BP 130/82 | HR 57 | Ht 68.5 in | Wt 148.0 lb

## 2019-11-13 DIAGNOSIS — I10 Essential (primary) hypertension: Secondary | ICD-10-CM | POA: Diagnosis not present

## 2019-11-13 DIAGNOSIS — E782 Mixed hyperlipidemia: Secondary | ICD-10-CM

## 2019-11-13 DIAGNOSIS — R079 Chest pain, unspecified: Secondary | ICD-10-CM

## 2019-11-13 DIAGNOSIS — M79604 Pain in right leg: Secondary | ICD-10-CM

## 2019-11-13 DIAGNOSIS — M79605 Pain in left leg: Secondary | ICD-10-CM

## 2019-11-13 DIAGNOSIS — R9431 Abnormal electrocardiogram [ECG] [EKG]: Secondary | ICD-10-CM

## 2019-11-13 DIAGNOSIS — I700 Atherosclerosis of aorta: Secondary | ICD-10-CM

## 2019-11-13 LAB — BASIC METABOLIC PANEL
BUN/Creatinine Ratio: 20 (ref 9–23)
BUN: 14 mg/dL (ref 6–24)
CO2: 26 mmol/L (ref 20–29)
Calcium: 10 mg/dL (ref 8.7–10.2)
Chloride: 100 mmol/L (ref 96–106)
Creatinine, Ser: 0.7 mg/dL (ref 0.57–1.00)
GFR calc Af Amer: 114 mL/min/{1.73_m2} (ref >59)
GFR calc non Af Amer: 99 mL/min/{1.73_m2} (ref >59)
Glucose: 84 mg/dL (ref 65–99)
Potassium: 4.7 mmol/L (ref 3.5–5.2)
Sodium: 141 mmol/L (ref 134–144)

## 2019-11-13 MED ORDER — METOPROLOL TARTRATE 100 MG PO TABS: ORAL_TABLET | ORAL | 0 refills | Status: DC

## 2019-11-13 MED ORDER — ROSUVASTATIN CALCIUM 5 MG PO TABS: 5.0000 mg | ORAL_TABLET | Freq: Every day | ORAL | 3 refills | Status: DC

## 2019-11-13 NOTE — Progress Notes (Signed)
Cardiology Office Note:   Date:  11/13/2019  NAME:  Alice Ray    MRN: [id number] DOB:  01/08/1966   PCP:  Joya Gaskins, FNP  Cardiologist:  Evalina Field, MD   Referring MD: Joya Gaskins, *   Chief Complaint  Patient presents with  . Chest Pain   History of Present Illness:   Alice Ray is a 54 y.o. female with a hx of HTN, HLD, anxiety who is being seen today for the evaluation of chest pain at the request of Joya Gaskins, FNP. Recent CT abdomen pelvis with mild aortic calcifications and mention of coronary atherosclerosis. This was a non gated study that I personally reviewed which shows possible minimal calcification in the RCA vs artifact. Very difficult to call that in my opinion.   She presents for evaluation of intermittent episodes of neck and chest pain.  She reports for the past few years she gets intermittent episodes where she feels intense neck pain that goes into her jaw as well as into her chest.  It mainly occurs without activity.  She is extremely active hiking 30 miles per day.  She reports she is training for a trip to [place].  She denies any chest pain or shortness of breath with this level of activity.  She reports that she had a stress echocardiogram done in [facility] last year that was normal.  She apparently was recently seen in the hospital with concerns for possible ischemic colitis.  There was concerns for atherosclerosis in the mesenteric vasculature.  Although she was admitted and did have elevated lactic acid she was evaluated by gastroenterology who felt she did have this condition.  She is a former smoker and smoked for 30 years but quit around 6 years ago.  Her cholesterol history is quite interesting her most recent LDL was 248 with a total cholesterol of 360.  Apparently she has had severely elevated cholesterol since she was in the [employer].  Her siblings and parents all have extremely high  cholesterol levels and history of heart attacks.  She is quite concerned she may end up having a heart attack.  She does have very poor pulses on exam.  Her EKG is without any alarming features.  She has been off and on several statin medications in the past.  She is most recently taking Crestor 5 mg every other day but has stopped.  Apparently she does get intense muscle pains with this.  She is also failed Zetia.  Other CVD risk factors include hypertension which is treated with metoprolol as well as losartan.  She does describe burning in her legs but has had several orthopedic procedures in her knees.  She reports she can get this with activity.  Denies chest pain, shortness of breath, palpitations today.   Past Medical History: Past Medical History:  Diagnosis Date  . Anxiety   . Arthritis   . Asthma   . Hyperlipidemia   . Hypertension   . Interstitial cystitis   . UTI (urinary tract infection)     Past Surgical History: Past Surgical History:  Procedure Laterality Date  . ANTERIOR FUSION CERVICAL SPINE    . BREAST LUMPECTOMY Right   . CARPAL TUNNEL RELEASE    . CYSTOSCOPY WITH HYDRODISTENSION AND BIOPSY    . HERNIA REPAIR     Rectus Hernia split repair  . HIP FRACTURE SURGERY    . LUMBAR SPINE SURGERY    . SEPTOPLASTY    .  VAGINAL HYSTERECTOMY    . VOCAL CORD INJECTION      Current Medications: Current Meds  Medication Sig  . albuterol (VENTOLIN HFA) 108 (90 Base) MCG/ACT inhaler Inhale 2 puffs into the lungs as needed for wheezing or shortness of breath.  . diclofenac Sodium (VOLTAREN) 1 % GEL Apply 1 application topically 4 (four) times daily as needed (joint pain).   Marland Kitchen EPINEPHrine 0.3 mg/0.3 mL IJ SOAJ injection Inject 0.3 mg into the muscle Once PRN for anaphylaxis.  Marland Kitchen LORazepam (ATIVAN) 1 MG tablet Take 1 mg by mouth daily as needed for anxiety.   Marland Kitchen losartan (COZAAR) 100 MG tablet Take 50 mg by mouth daily.  . metoprolol succinate (TOPROL-XL) 25 MG 24 hr tablet Take  25 mg by mouth every evening.  . montelukast (SINGULAIR) 10 MG tablet Take 10 mg by mouth daily as needed (allergies, asthma).   . pantoprazole (PROTONIX) 40 MG tablet Take 40 mg by mouth every morning.  . tiaGABine (GABITRIL) 4 MG tablet Take 4 mg by mouth at bedtime.     Allergies:    Penicillins   Social History: Social History   Socioeconomic History  . Marital status: Married    Spouse name: Not on file  . Number of children: 3  . Years of education: Not on file  . Highest education level: Not on file  Occupational History  . Not on file  Tobacco Use  . Smoking status: Former Smoker    Years: 35.00    Types: Cigarettes  . Smokeless tobacco: Never Used  Substance and Sexual Activity  . Alcohol use: Never  . Drug use: Never  . Sexual activity: Not on file  Other Topics Concern  . Not on file  Social History Narrative  . Not on file   Social Determinants of Health   Financial Resource Strain:   . Difficulty of Paying Living Expenses:   Food Insecurity:   . Worried About Charity fundraiser in the Last Year:   . Arboriculturist in the Last Year:   Transportation Needs:   . Film/video editor (Medical):   Marland Kitchen Lack of Transportation (Non-Medical):   Physical Activity:   . Days of Exercise per Week:   . Minutes of Exercise per Session:   Stress:   . Feeling of Stress :   Social Connections:   . Frequency of Communication with Friends and Family:   . Frequency of Social Gatherings with Friends and Family:   . Attends Religious Services:   . Active Member of Clubs or Organizations:   . Attends Archivist Meetings:   Marland Kitchen Marital Status:      Family History: The patient's family history includes Colitis in her father and paternal aunt; Diverticulosis in her sister; Heart disease in her father.  ROS:   All other ROS reviewed and negative. Pertinent positives noted in the HPI.     EKGs/Labs/Other Studies Reviewed:   The following studies were  personally reviewed by me today:  EKG:  EKG is ordered today.  The ekg ordered today demonstrates sinus bradycardia, heart rate 57, old anteroseptal infarct, no acute ischemic changes, and was personally reviewed by me.   Recent Labs: 09/18/2019: ALT 21; BUN 11; Creatinine, Ser 0.84; Hemoglobin 13.5; Platelets 326.0; Potassium 4.5; Sodium 139   Recent Lipid Panel No results found for: CHOL, TRIG, HDL, CHOLHDL, VLDL, LDLCALC, LDLDIRECT  Physical Exam:   VS:  BP 130/82   Pulse (!) 57  Ht 5' 8.5" (1.74 m)   Wt 148 lb (67.1 kg)   SpO2 97%   BMI 22.18 kg/m    Wt Readings from Last 3 Encounters:  11/13/19 148 lb (67.1 kg)  09/18/19 146 lb (66.2 kg)    General: Well nourished, well developed, in no acute distress Heart: Atraumatic, normal size  Eyes: PEERLA, EOMI  Neck: Supple, no JVD Endocrine: No thryomegaly Cardiac: Normal S1, S2; RRR; no murmurs, rubs, or gallops Lungs: Clear to auscultation bilaterally, no wheezing, rhonchi or rales  Abd: Soft, nontender, no hepatomegaly  Ext: No edema, absent pulses Musculoskeletal: No deformities, BUE and BLE strength normal and equal Skin: Warm and dry, no rashes   Neuro: Alert and oriented to person, place, time, and situation, CNII-XII grossly intact, no focal deficits  Psych: Normal mood and affect   ASSESSMENT:   Sanjuana Mruk is a 54 y.o. female who presents for the following: 1. Chest pain, unspecified type   2. Nonspecific abnormal electrocardiogram (ECG) (EKG)   3. Essential hypertension   4. Mixed hyperlipidemia   5. Aortic atherosclerosis (HCC)   6. Pain in both lower extremities     PLAN:   1. Chest pain, unspecified type 2. Nonspecific abnormal electrocardiogram (ECG) (EKG) -She presents with intermittent episodes of neck pain that also radiates into her jaw and chest.  Does not occur with exercise.  Very atypical.  EKG with possible old anteroseptal infarct but review of echocardiograms in old system showed no  evidence of wall motion normality.  I suspect this is all just related to blood pressure. -Most recent LDL cholesterol 248.  She could be having intermittent episodes of angina equivalents.  I suspect she also has familial hypercholesterolemia.  We will proceed with a coronary CTA.  She will take metoprolol tartrate 100 mg twice before the scan.  She will give [place] a BMP today.  This will further restratify her let us know how intense her cholesterol-lowering agent should be.  3. Essential hypertension -Well-controlled today.  No change in medications.  4. Mixed hyperlipidemia -Her most recent lipid profile from last year shows a total cholesterol 360, LDL 248, HDL 78, triglycerides 84.  She reports all fingers have severely elevated high cholesterol levels.  She apparently is been on and off statins with extremely high levels of cholesterol.  I suspect she has familial hypercholesterolemia.  The only reason she likely has not had a cardiac event is related to her high HDL as well as high level of activity. -I did discuss genetic testing with her.  We will proceed with a familial hypercholesterolemia screening panel through [facility].  She was given the kit today.  Order was placed.  This may help [place] get her qualify for PCSK9 inhibitor.  I suspect she will need this to really reduce her risk of having a heart attack or stroke in life.  This will also help with family members as they will test her children for free if she screens positive. -In the interim we will go ahead and restart her on Crestor 5 mg every other day.  We will see if she can tolerate this.  If not likely will pursue PCSK9 inhibitor.  5. Aortic atherosclerosis (HCC) -Mild plaque on CT scan of her abdomen and pelvis.  Also had possible stenosis in the IMA.  Likely related hypercholesterolemia.  6. Pain in both lower extremities -She has absent pulses in the lower extremities.  She does report some burning sensation in her legs.  She also  has intermittent leg pain which has not been alleviated by multiple back operations.  I am highly suspicious for PAD.  We will go ahead and obtain ABIs and arterial duplex of lower extremities.  This will also further stratify her for likely PCSK9 inhibitor.  Disposition: Return in about 3 months (around 02/12/2020).  Medication Adjustments/Labs and Tests Ordered: Current medicines are reviewed at length with the patient today.  Concerns regarding medicines are outlined above.  Orders Placed This Encounter  Procedures  . CT CORONARY MORPH W/CTA COR W/SCORE W/CA W/CM &/OR WO/CM  . CT CORONARY FRACTIONAL FLOW RESERVE DATA PREP  . CT CORONARY FRACTIONAL FLOW RESERVE FLUID ANALYSIS  . Basic metabolic panel  . EKG 12-Lead  . VAS [place] LOWER EXTREMITY ARTERIAL DUPLEX  . VAS [place] ABI WITH/WO TBI   Meds ordered this encounter  Medications  . rosuvastatin (CRESTOR) 5 MG tablet    Sig: Take 1 tablet (5 mg total) by mouth daily.    Dispense:  90 tablet    Refill:  3  . metoprolol tartrate (LOPRESSOR) 100 MG tablet    Sig: Take 1 tablet by mouth once for procedure.    Dispense:  1 tablet    Refill:  0    Patient Instructions  Medication Instructions:  Start Crestor 5 mg every other day  Take Metoprolol Tartrate 100 mg 2 hours before CT when scheduled.   Hold the Metoprolol Succinate the day of.   *If you need a refill on your cardiac medications before your next appointment, please call your pharmacy*   Lab Work: BMET today  If you have labs (blood work) drawn today and your tests are completely normal, you will receive your results only by: Marland Kitchen MyChart Message (if you have MyChart) OR . A paper copy in the mail If you have any lab test that is abnormal or we need to change your treatment, we will call you to review the results.   Testing/Procedures: Your physician has requested that you have cardiac CT. Cardiac computed tomography (CT) is a painless test that uses an x-ray machine to take  clear, detailed pictures of your heart. For further information please visit HugeFiesta.tn. Please follow instruction sheet as given.   Your physician has requested that you have a lower or upper extremity arterial duplex. This test is an ultrasound of the arteries in the legs or arms. It looks at arterial blood flow in the legs and arms. Allow one hour for Lower and Upper Arterial scans. There are no restrictions or special instructions  Your physician has requested that you have an ankle brachial index (ABI). During this test an ultrasound and blood pressure cuff are used to evaluate the arteries that supply the arms and legs with blood. Allow thirty minutes for this exam. There are no restrictions or special instructions.    Follow-Up: At [facility], you and your health needs are our priority.  As part of our continuing mission to provide you with exceptional heart care, we have created designated Provider Care Teams.  These Care Teams include your primary Cardiologist (physician) and Advanced Practice Providers (APPs -  Physician Assistants and Nurse Practitioners) who all work together to provide you with the care you need, when you need it.  We recommend signing up for the patient portal called "MyChart".  Sign up information is provided on this After Visit Summary.  MyChart is used to connect with patients for Virtual Visits (Telemedicine).  Patients are able  to view lab/test results, encounter notes, upcoming appointments, etc.  Non-urgent messages can be sent to your provider as well.   To learn more about what you can do with MyChart, go to [email or web address].    Your next appointment:   3 month(s)  The format for your next appointment:   In Person  Provider:   Eleonore Chiquito, MD   Other Instructions   Your cardiac CT will be scheduled at one of the below locations:   [facility] [address] [place], [place] [address] [phone]  If  scheduled at [facility], please arrive at the [facility] main entrance of [facility] 30 minutes prior to test start time. Proceed to the [facility] Radiology Department (first floor) to check-in and test prep.  Please follow these instructions carefully (unless otherwise directed):  Hold all erectile dysfunction medications at least 3 days (72 hrs) prior to test.  On the Night Before the Test: . Be sure to Drink plenty of water. . Do not consume any caffeinated/decaffeinated beverages or chocolate 12 hours prior to your test. . Do not take any antihistamines 12 hours prior to your test. . If you take Metformin do not take 24 hours prior to test.  On the Day of the Test: . Drink plenty of water. Do not drink any water within one hour of the test. . Do not eat any food 4 hours prior to the test. . You may take your regular medications prior to the test.  . Take metoprolol (Lopressor) two hours prior to test. . HOLD Furosemide/Hydrochlorothiazide morning of the test. . FEMALES- please wear underwire-free bra if available       After the Test: . Drink plenty of water. . After receiving IV contrast, you may experience a mild flushed feeling. This is normal. . On occasion, you may experience a mild rash up to 24 hours after the test. This is not dangerous. If this occurs, you can take Benadryl 25 mg and increase your fluid intake. . If you experience trouble breathing, this can be serious. If it is severe call 911 IMMEDIATELY. If it is mild, please call our office. . If you take any of these medications: Glipizide/Metformin, Avandament, Glucavance, please do not take 48 hours after completing test unless otherwise instructed.   Once we have confirmed authorization from your insurance company, we will call you to set up a date and time for your test.   For non-scheduling related questions, please contact the cardiac imaging nurse navigator should you have any  questions/concerns: Marchia Bond, RN Navigator Cardiac Imaging [facility] [phone] office  For scheduling needs, including cancellations and rescheduling, please call [phone].        Signed, Addison Naegeli. Audie Box, [place]  [address] [place], [place] [address] [phone]  11/13/2019 10:25 AM

## 2019-11-13 NOTE — Patient Instructions (Addendum)
Medication Instructions:  Start Crestor 5 mg every other day  Take Metoprolol Tartrate 100 mg 2 hours before CT when scheduled.   Hold the Metoprolol Succinate the day of.   *If you need a refill on your cardiac medications before your next appointment, please call your pharmacy*   Lab Work: BMET today  If you have labs (blood work) drawn today and your tests are completely normal, you will receive your results only by: Marland Kitchen MyChart Message (if you have MyChart) OR . A paper copy in the mail If you have any lab test that is abnormal or we need to change your treatment, we will call you to review the results.   Testing/Procedures: Your physician has requested that you have cardiac CT. Cardiac computed tomography (CT) is a painless test that uses an x-ray machine to take clear, detailed pictures of your heart. For further information please visit HugeFiesta.tn. Please follow instruction sheet as given.   Your physician has requested that you have a lower or upper extremity arterial duplex. This test is an ultrasound of the arteries in the legs or arms. It looks at arterial blood flow in the legs and arms. Allow one hour for Lower and Upper Arterial scans. There are no restrictions or special instructions  Your physician has requested that you have an ankle brachial index (ABI). During this test an ultrasound and blood pressure cuff are used to evaluate the arteries that supply the arms and legs with blood. Allow thirty minutes for this exam. There are no restrictions or special instructions.    Follow-Up: At [facility], you and your health needs are our priority.  As part of our continuing mission to provide you with exceptional heart care, we have created designated Provider Care Teams.  These Care Teams include your primary Cardiologist (physician) and Advanced Practice Providers (APPs -  Physician Assistants and Nurse Practitioners) who all work together to provide you with the  care you need, when you need it.  We recommend signing up for the patient portal called "MyChart".  Sign up information is provided on this After Visit Summary.  MyChart is used to connect with patients for Virtual Visits (Telemedicine).  Patients are able to view lab/test results, encounter notes, upcoming appointments, etc.  Non-urgent messages can be sent to your provider as well.   To learn more about what you can do with MyChart, go to [email or web address].    Your next appointment:   3 month(s)  The format for your next appointment:   In Person  Provider:   Eleonore Chiquito, MD   Other Instructions   Your cardiac CT will be scheduled at one of the below locations:   [facility] [address] [place], [place] [address] [phone]  If scheduled at [facility], please arrive at the [facility] main entrance of [facility] 30 minutes prior to test start time. Proceed to the [facility] Radiology Department (first floor) to check-in and test prep.  Please follow these instructions carefully (unless otherwise directed):  Hold all erectile dysfunction medications at least 3 days (72 hrs) prior to test.  On the Night Before the Test: . Be sure to Drink plenty of water. . Do not consume any caffeinated/decaffeinated beverages or chocolate 12 hours prior to your test. . Do not take any antihistamines 12 hours prior to your test. . If you take Metformin do not take 24 hours prior to test.  On the Day of the Test: .  Drink plenty of water. Do not drink any water within one hour of the test. . Do not eat any food 4 hours prior to the test. . You may take your regular medications prior to the test.  . Take metoprolol (Lopressor) two hours prior to test. . HOLD Furosemide/Hydrochlorothiazide morning of the test. . FEMALES- please wear underwire-free bra if available       After the Test: . Drink plenty of water. . After receiving IV contrast, you  may experience a mild flushed feeling. This is normal. . On occasion, you may experience a mild rash up to 24 hours after the test. This is not dangerous. If this occurs, you can take Benadryl 25 mg and increase your fluid intake. . If you experience trouble breathing, this can be serious. If it is severe call 911 IMMEDIATELY. If it is mild, please call our office. . If you take any of these medications: Glipizide/Metformin, Avandament, Glucavance, please do not take 48 hours after completing test unless otherwise instructed.   Once we have confirmed authorization from your insurance company, we will call you to set up a date and time for your test.   For non-scheduling related questions, please contact the cardiac imaging nurse navigator should you have any questions/concerns: Marchia Bond, RN Navigator Cardiac Imaging [facility] [phone] office  For scheduling needs, including cancellations and rescheduling, please call [phone].

## 2019-11-16 ENCOUNTER — Encounter (HOSPITAL_COMMUNITY): Payer: BC Managed Care – PPO

## 2019-11-18 ENCOUNTER — Other Ambulatory Visit: Payer: Self-pay

## 2019-11-18 ENCOUNTER — Ambulatory Visit (HOSPITAL_COMMUNITY)
Admission: RE | Admit: 2019-11-18 | Discharge: 2019-11-18 | Disposition: A | Discharge status: Home / Self Care | Payer: BC Managed Care – PPO | Source: Ambulatory Visit | Attending: Cardiovascular Disease | Admitting: Cardiovascular Disease

## 2019-11-18 DIAGNOSIS — M79604 Pain in right leg: Secondary | ICD-10-CM | POA: Diagnosis present

## 2019-11-18 DIAGNOSIS — M79605 Pain in left leg: Secondary | ICD-10-CM | POA: Diagnosis not present

## 2019-11-27 ENCOUNTER — Telehealth: Payer: Self-pay | Admitting: Cardiovascular Disease

## 2019-11-27 NOTE — Telephone Encounter (Signed)
Called Alice Ray. She is has heterozygous familial hypercholesterolemia (LDLR variant) identified on recent genetic test. Not able to tolerate statins. Needs PCSK9 inhibitor evaluation. Will reach out to pharmacy at [facility] office to get her prior authorization going.   I also discussed that she will need to see a genetic counselor for cascade screening for her children and any first degree relatives. [facility] should reach out to her bout this.  [place] T. Audie Box, [place]  [address] [place], [place] [address] [phone]  10:51 AM

## 2019-12-10 ENCOUNTER — Ambulatory Visit (INDEPENDENT_AMBULATORY_CARE_PROVIDER_SITE_OTHER): Payer: BC Managed Care – PPO | Admitting: Pharmacist

## 2019-12-10 ENCOUNTER — Other Ambulatory Visit: Payer: Self-pay

## 2019-12-10 DIAGNOSIS — T466X5A Adverse effect of antihyperlipidemic and antiarteriosclerotic drugs, initial encounter: Secondary | ICD-10-CM

## 2019-12-10 DIAGNOSIS — G720 Drug-induced myopathy: Secondary | ICD-10-CM

## 2019-12-10 DIAGNOSIS — E7801 Familial hypercholesterolemia: Secondary | ICD-10-CM | POA: Diagnosis not present

## 2019-12-10 NOTE — Patient Instructions (Signed)
Your Results:             Your most recent labs Goal  Total Cholesterol <360 < 200  Triglycerides 84 < 150  HDL (good cholesterol) 78 > 40  LDL (bad cholesterol 248 < 70      Medication changes: *Decrease rosuvastatin to 5mg  every Monday and Friday* *START prior authorization for Repatha 420mg  every month* * May decrease rosuvastatin to 5mg  every Monday once able to start Repatha*  Lab orders: Repeat fasting blood work 2 months after starting repatha  Patient Assistance:  The [employer] foundation offers assistance to help pay for medication copays.  They will cover copays for all cholesterol lowering meds, including statins, fibrates, omega-3 oils, ezetimibe, Repatha, Praluent, Nexletol, Nexlizet.  The cards are usually good for $2,500 or 12 months, whichever comes first. 1. Go to healthwellfoundation.org 2. Click on "Apply Now" 3. Answer questions as to whom is applying (patient or representative) 4. Your disease fund will be "hypercholesterolemia - Medicare access" 5. They will ask questions about finances and which medications you are taking for cholesterol 6. When you submit, the approval is usually within minutes.  You will need to print the card information from the site 7. You will need to show this information to your pharmacy, they will bill your Medicare Part D plan first -then bill [employer] --for the copay.   You can also call them at [phone], although the hold times can be quite long.   Thank you for choosing [facility]

## 2019-12-10 NOTE — Progress Notes (Signed)
Patient ID: Alice Ray                 DOB: 12-May-1966                    MRN: [id number]     HPI: Alice Ray is a 54 y.o. female patient referred to lipid clinic by Dr Audie Box.  [facility] is significant for hypertension, hyperlipidemia, anxiety, aortic atherosclerosis (per abdominal CT) and hx of chest pain. Patinet is intolerant to ezetimibe , simvastatin and rosuvastatin. Currently taking low dose rosuvastatin bur reports muscle pain. Her baseline LDL (d) is 248 and genetic testing was positive for heterozygous hyperlipidemia. Patient is very active and is currently training to climb [facility].   Current Medications:  Rosuvastatin 5mg  every day  Intolerances:  Ezetimibe  Simvastatin - sever muscle pain and damage Rosuvastatin- muscle pain  LDL goal: 50-60% drop from baseline  Diet: eat lots vegetables, lean meat , avoid fried food and high carbohydrates intake  Exercise: hicking 30-75 miles per week; resistant excecise  Family History: The patient's family history includes Colitis in her father and paternal aunt; Diverticulosis in her sister; Heart disease in her father (CABG x 5), elevated cholesterol in father and siblings  Social History: denies tobacco(former smoker)  or alcohol use  Labs: 10/23/2019: CHO 360; TG 84; HDL 78; LDL(d) 248 - see care everywhere 11/25/2019: LDLR gene variant - heterozygous hypercholesterolemia  Past Medical History:  Diagnosis Date  . Anxiety   . Arthritis   . Asthma   . Heterozygous familial hypercholesterolemia   . Hyperlipidemia   . Hypertension   . Interstitial cystitis   . UTI (urinary tract infection)     Current Outpatient Medications on File Prior to Visit  Medication Sig Dispense Refill  . albuterol (VENTOLIN HFA) 108 (90 Base) MCG/ACT inhaler Inhale 2 puffs into the lungs as needed for wheezing or shortness of breath.    . diclofenac Sodium (VOLTAREN) 1 % GEL Apply 1 application topically 4 (four) times  daily as needed (joint pain).     Marland Kitchen EPINEPHrine 0.3 mg/0.3 mL IJ SOAJ injection Inject 0.3 mg into the muscle Once PRN for anaphylaxis.    Marland Kitchen LORazepam (ATIVAN) 1 MG tablet Take 1 mg by mouth daily as needed for anxiety.     Marland Kitchen losartan (COZAAR) 100 MG tablet Take 50 mg by mouth daily.    . metoprolol succinate (TOPROL-XL) 25 MG 24 hr tablet Take 25 mg by mouth every evening.    . montelukast (SINGULAIR) 10 MG tablet Take 10 mg by mouth daily as needed (allergies, asthma).     . pantoprazole (PROTONIX) 40 MG tablet Take 40 mg by mouth every morning.    . rosuvastatin (CRESTOR) 5 MG tablet Take 1 tablet (5 mg total) by mouth daily. 90 tablet 3  . tiaGABine (GABITRIL) 4 MG tablet Take 4 mg by mouth at bedtime.     No current facility-administered medications on file prior to visit.    Allergies  Allergen Reactions  . Penicillins Anaphylaxis    Did it involve swelling of the face/tongue/throat, SOB, or low BP? Yes Did it involve sudden or severe rash/hives, skin peeling, or any reaction on the inside of your mouth or nose? Yes Did you need to seek medical attention at a hospital or doctor's office? Unknown When did it last happen? Childhood (~ 54YO) If all above answers are "NO", may proceed with cephalosporin use.     Statin  myopathy Baseline LDL of 248mg /dL and positive genetic testing for familial hypercholesterolemia on patient with strong family hx of CAD. Patient follows an appropriate diet and is physically active, also continues to use rosuvastatin while experiencing some muscle discomfort.  We discussed PCSK9i therapy including MOA, prior-authorization process, storage , administration and potential side effects. Co-pay car and sample were provided today as well.   Will decrease rosuvastatin dose to 5mg  every Monday and Friday ONLY, start Repatha 420mg  every 30 days, and repeat fasting lipid panel 2-3 months after initiating therapy. Patient is aware of potential lack/decrase of  response to PCSK9i d/t genetic mutations.   Brinklee Cisse Rodriguez-Guzman PharmD, BCPS, [place] [address] 12/14/2019 7:16 PM

## 2019-12-11 ENCOUNTER — Telehealth (HOSPITAL_COMMUNITY): Payer: Self-pay | Admitting: *Deleted

## 2019-12-11 NOTE — Telephone Encounter (Signed)
Pt returned phone call and all appropriate instructions regarding cardiac CT were reviewed.  Pt expressed understanding of instructions.  Burley Saver, RN 12/11/19

## 2019-12-11 NOTE — Telephone Encounter (Signed)
Attempted to call patient regarding upcoming cardiac CT appointment. Left message on voicemail with name and callback number Cleo Santucci Tai RN Navigator Cardiac Imaging [facility] [phone] Office [phone] Cell 

## 2019-12-14 ENCOUNTER — Encounter: Payer: Self-pay | Admitting: Cardiovascular Disease

## 2019-12-14 ENCOUNTER — Ambulatory Visit (HOSPITAL_COMMUNITY)
Admission: RE | Admit: 2019-12-14 | Discharge: 2019-12-14 | Disposition: A | Discharge status: Home / Self Care | Payer: BC Managed Care – PPO | Source: Ambulatory Visit | Attending: Cardiovascular Disease | Admitting: Cardiovascular Disease

## 2019-12-14 ENCOUNTER — Other Ambulatory Visit: Payer: Self-pay

## 2019-12-14 DIAGNOSIS — R079 Chest pain, unspecified: Secondary | ICD-10-CM | POA: Diagnosis not present

## 2019-12-14 DIAGNOSIS — E7801 Familial hypercholesterolemia: Secondary | ICD-10-CM | POA: Insufficient documentation

## 2019-12-14 DIAGNOSIS — G720 Drug-induced myopathy: Secondary | ICD-10-CM | POA: Insufficient documentation

## 2019-12-14 DIAGNOSIS — T466X5A Adverse effect of antihyperlipidemic and antiarteriosclerotic drugs, initial encounter: Secondary | ICD-10-CM | POA: Insufficient documentation

## 2019-12-14 MED ORDER — NITROGLYCERIN 0.4 MG SL SUBL
SUBLINGUAL_TABLET | SUBLINGUAL | Status: AC
  Filled 2019-12-14: qty 2

## 2019-12-14 MED ORDER — NITROGLYCERIN 0.4 MG SL SUBL
0.8000 mg | SUBLINGUAL_TABLET | Freq: Once | SUBLINGUAL | Status: AC
  Administered 2019-12-14: 08:00:00 0.8 mg via SUBLINGUAL

## 2019-12-14 MED ORDER — IOHEXOL 350 MG/ML SOLN
80.0000 mL | Freq: Once | INTRAVENOUS | Status: AC | PRN
  Administered 2019-12-14: 09:00:00 80 mL via INTRAVENOUS

## 2019-12-14 NOTE — Assessment & Plan Note (Signed)
Baseline LDL of 248mg /dL and positive genetic testing for familial hypercholesterolemia on patient with strong family hx of CAD. Patient follows an appropriate diet and is physically active, also continues to use rosuvastatin while experiencing some muscle discomfort.  We discussed PCSK9i therapy including MOA, prior-authorization process, storage , administration and potential side effects. Co-pay car and sample were provided today as well.   Will decrease rosuvastatin dose to 5mg  every Monday and Friday ONLY, start Repatha 420mg  every 30 days, and repeat fasting lipid panel 2-3 months after initiating therapy. Patient is aware of potential lack/decrase of response to PCSK9i d/t genetic mutations.

## 2019-12-21 ENCOUNTER — Telehealth: Payer: Self-pay

## 2019-12-21 MED ORDER — REPATHA PUSHTRONEX SYSTEM 420 MG/3.5ML ~~LOC~~ SOCT: 420.0000 mg | SUBCUTANEOUS | 11 refills | Status: DC

## 2019-12-21 NOTE — Telephone Encounter (Signed)
Called and spoke w/pt regarding the approval of the repatha pushtronix. Pt stated they already activated their copay card. rx sent for pushtronix

## 2020-01-20 ENCOUNTER — Telehealth: Payer: Self-pay

## 2020-01-20 DIAGNOSIS — T466X5A Adverse effect of antihyperlipidemic and antiarteriosclerotic drugs, initial encounter: Secondary | ICD-10-CM

## 2020-01-20 DIAGNOSIS — E7801 Familial hypercholesterolemia: Secondary | ICD-10-CM

## 2020-01-20 NOTE — Telephone Encounter (Signed)
Called and lmomed he pt stating that they do need a lipid panel fasting, orders placed, pt instructed to call back if unaffordable.

## 2020-01-21 LAB — LIPID PANEL
Chol/HDL Ratio: 2.1 ratio (ref 0.0–4.4)
Cholesterol, Total: 176 mg/dL (ref 100–199)
HDL: 82 mg/dL (ref >39)
LDL Chol Calc (NIH): 85 mg/dL (ref 0–99)
Triglycerides: 44 mg/dL (ref 0–149)
VLDL Cholesterol Cal: 9 mg/dL (ref 5–40)

## 2020-02-10 NOTE — Progress Notes (Signed)
Cardiology Office Note:   Date:  02/12/2020  NAME:  Alice Ray    MRN: [id number] DOB:  11-05-1965   PCP:  Joya Gaskins, FNP  Cardiologist:  Evalina Field, MD  Electrophysiologist:  None   Referring MD: Joya Gaskins, *   Chief Complaint  Patient presents with   Follow-up   History of Present Illness:   Alice Ray is a 54 y.o. female with a hx of FH, CAD, HTN who presents for follow-up. Evaluated for CP and had mild CAD on CCTA. Genetic testing conformed she is heterozygous FH. Started on repatha due to statin intolerance. Doing well on Repatha.  Her most recent LDL cholesterol has been reduced to 85.  I think this is reassuring.  Her 2 children have tested positive for FH.  Her sister and her father were negative.  She is relieved to know that she fell and is why her cholesterol is at high.  Blood pressure is well controlled at 111/69.  She did have some pain in her legs but is still able to maintain a high level of activity.  She walks 5 miles per day.  Her ABIs were negative for PAD.  She does have poor pulses but does not appear to have any significant PAD.  She still has plans to hike [place] early next year.  Problem List 1. Heterozygous familial hypercholesterolemia -statin intolerant -on repatha  -T chol 176, HDL 82, LDL 85, TG 44 2. CAD -CAC score 105 (96th percentile) -mild (25-49%) LAD plaque  3. HTN   Past Medical History: Past Medical History:  Diagnosis Date   Anxiety    Arthritis    Asthma    Coronary artery disease    Heterozygous familial hypercholesterolemia    Hyperlipidemia    Hypertension    Interstitial cystitis    UTI (urinary tract infection)     Past Surgical History: Past Surgical History:  Procedure Laterality Date   ANTERIOR FUSION CERVICAL SPINE     BREAST LUMPECTOMY Right    CARPAL TUNNEL RELEASE     CYSTOSCOPY WITH HYDRODISTENSION AND BIOPSY     HERNIA REPAIR     Rectus  Hernia split repair   HIP FRACTURE SURGERY     LUMBAR SPINE SURGERY     SEPTOPLASTY     VAGINAL HYSTERECTOMY     VOCAL CORD INJECTION      Current Medications: Current Meds  Medication Sig   albuterol (VENTOLIN HFA) 108 (90 Base) MCG/ACT inhaler Inhale 2 puffs into the lungs as needed for wheezing or shortness of breath.   diclofenac Sodium (VOLTAREN) 1 % GEL Apply 1 application topically 4 (four) times daily as needed (joint pain).    EPINEPHrine 0.3 mg/0.3 mL IJ SOAJ injection Inject 0.3 mg into the muscle Once PRN for anaphylaxis.   Evolocumab with Infusor ([place]) 420 MG/3.5ML SOCT Inject 420 mg into the skin every 30 (thirty) days.   LORazepam (ATIVAN) 1 MG tablet Take 1 mg by mouth daily as needed for anxiety.    losartan (COZAAR) 100 MG tablet Take 50 mg by mouth daily.   metoprolol succinate (TOPROL-XL) 25 MG 24 hr tablet Take 25 mg by mouth every evening.   montelukast (SINGULAIR) 10 MG tablet Take 10 mg by mouth daily as needed (allergies, asthma).    pantoprazole (PROTONIX) 40 MG tablet Take 40 mg by mouth every morning.   tiaGABine (GABITRIL) 4 MG tablet Take 4 mg by mouth at bedtime.  Allergies:    Penicillins   Social History: Social History   Socioeconomic History   Marital status: Married    Spouse name: Not on file   Number of children: 3   Years of education: Not on file   Highest education level: Not on file  Occupational History   Not on file  Tobacco Use   Smoking status: Former Smoker    Years: 35.00    Types: Cigarettes   Smokeless tobacco: Never Used  Substance and Sexual Activity   Alcohol use: Never   Drug use: Never   Sexual activity: Not on file  Other Topics Concern   Not on file  Social History Narrative   Not on file   Social Determinants of Health   Financial Resource Strain:    Difficulty of Paying Living Expenses:   Food Insecurity:    Worried About Charity fundraiser in the  Last Year:    Arboriculturist in the Last Year:   Transportation Needs:    Film/video editor (Medical):    Lack of Transportation (Non-Medical):   Physical Activity:    Days of Exercise per Week:    Minutes of Exercise per Session:   Stress:    Feeling of Stress :   Social Connections:    Frequency of Communication with Friends and Family:    Frequency of Social Gatherings with Friends and Family:    Attends Religious Services:    Active Member of Clubs or Organizations:    Attends Archivist Meetings:    Marital Status:      Family History: The patient's family history includes Colitis in her father and paternal aunt; Diverticulosis in her sister; Heart disease in her father.  ROS:   All other ROS reviewed and negative. Pertinent positives noted in the HPI.     EKGs/Labs/Other Studies Reviewed:   The following studies were personally reviewed by me today:  LE arterial duplex (11/18/2019) Summary:  Right: Resting right ankle-brachial index is within normal range. No  evidence of significant right lower extremity arterial disease. The right  toe-brachial index is normal.   Left: Resting left ankle-brachial index is within normal range. No  evidence of significant left lower extremity arterial disease. The left  toe-brachial index is borderline abnormal.   CCTA 12/14/2019 IMPRESSION: 1. Coronary calcium score of 105. This was 96th percentile for age and sex matched controls.  2. Normal coronary origin with right dominance.  3. Mild, non-calcified plaque in the mid LAD (25-49%).  4. Diffuse minimal plaque (<25%) in the RCA/LCX (<25%).  5. Small PFO.  Recent Labs: 09/18/2019: ALT 21; Hemoglobin 13.5; Platelets 326.0 11/13/2019: BUN 14; Creatinine, Ser 0.70; Potassium 4.7; Sodium 141   Recent Lipid Panel    Component Value Date/Time   CHOL 176 01/21/2020 0835   TRIG 44 01/21/2020 0835   HDL 82 01/21/2020 0835   CHOLHDL 2.1 01/21/2020  0835   LDLCALC 85 01/21/2020 0835    Physical Exam:   VS:  BP 111/69    Pulse 74    Ht 5' 8.5" (1.74 m)    Wt 147 lb 12.8 oz (67 kg)    SpO2 98%    BMI 22.15 kg/m    Wt Readings from Last 3 Encounters:  02/12/20 147 lb 12.8 oz (67 kg)  12/10/19 150 lb (68 kg)  11/13/19 148 lb (67.1 kg)    General: Well nourished, well developed, in no acute distress Heart: Atraumatic, normal  size  Eyes: PEERLA, EOMI  Neck: Supple, no JVD Endocrine: No thryomegaly Cardiac: Normal S1, S2; RRR; no murmurs, rubs, or gallops Lungs: Clear to auscultation bilaterally, no wheezing, rhonchi or rales  Abd: Soft, nontender, no hepatomegaly  Ext: No edema, diminished pulses Musculoskeletal: No deformities, BUE and BLE strength normal and equal Skin: Warm and dry, no rashes   Neuro: Alert and oriented to person, place, time, and situation, CNII-XII grossly intact, no focal deficits  Psych: Normal mood and affect   ASSESSMENT:   Naja Apperson is a 53 y.o. female who presents for the following: 1. Heterozygous familial hypercholesterolemia   2. Statin myopathy   3. Coronary artery disease involving native coronary artery of native heart without angina pectoris   4. Essential hypertension     PLAN:   1. Heterozygous familial hypercholesterolemia 2. Statin myopathy -Genetically positive for heterozygous FH.  Her 2 children have this disorder.  She was started on Repatha.  Most recent LDL 85.  She is tolerating Crestor 5 mg every other day.  Seems to be doing well on this.  We will continue with aggressive cholesterol lowering given her CAD and now heterozygous FH.  3. Coronary artery disease involving native coronary artery of native heart without angina pectoris -Coronary calcium score in the 96 percentile.  Nonobstructive CAD on coronary CTA.  She has familial hyper cholesterolemia, heterozygous.  On Repatha.  Tolerating Crestor 5 mg every other day.  We will continue this for now.  4. Essential  hypertension -Blood pressure well controlled.  She will continue metoprolol as well as losartan.  Disposition: Return in about 5 months (around 07/14/2020).  Medication Adjustments/Labs and Tests Ordered: Current medicines are reviewed at length with the patient today.  Concerns regarding medicines are outlined above.  No orders of the defined types were placed in this encounter.  No orders of the defined types were placed in this encounter.   Patient Instructions  Medication Instructions:  The current medical regimen is effective;  continue present plan and medications.  *If you need a refill on your cardiac medications before your next appointment, please call your pharmacy*   Follow-Up: At [facility], you and your health needs are our priority.  As part of our continuing mission to provide you with exceptional heart care, we have created designated Provider Care Teams.  These Care Teams include your primary Cardiologist (physician) and Advanced Practice Providers (APPs -  Physician Assistants and Nurse Practitioners) who all work together to provide you with the care you need, when you need it.  We recommend signing up for the patient portal called "MyChart".  Sign up information is provided on this After Visit Summary.  MyChart is used to connect with patients for Virtual Visits (Telemedicine).  Patients are able to view lab/test results, encounter notes, upcoming appointments, etc.  Non-urgent messages can be sent to your provider as well.   To learn more about what you can do with MyChart, go to [email or web address].    Your next appointment:   6 month(s)  The format for your next appointment:   In Person  Provider:   Eleonore Chiquito, MD      Time Spent with Patient: I have spent a total of 25 minutes with patient reviewing hospital notes, telemetry, EKGs, labs and examining the patient as well as establishing an assessment and plan that was discussed with the patient.   > 50% of time was spent in direct patient care.  Signed, Addison Naegeli.  Audie Box, [place]  [address] [place], [place] [address] [phone]  02/12/2020 3:21 PM

## 2020-02-12 ENCOUNTER — Ambulatory Visit (INDEPENDENT_AMBULATORY_CARE_PROVIDER_SITE_OTHER): Payer: BC Managed Care – PPO | Admitting: Cardiovascular Disease

## 2020-02-12 ENCOUNTER — Encounter: Payer: Self-pay | Admitting: Cardiovascular Disease

## 2020-02-12 VITALS — BP 111/69 | HR 74 | Ht 68.5 in | Wt 147.8 lb

## 2020-02-12 DIAGNOSIS — E7801 Familial hypercholesterolemia: Secondary | ICD-10-CM

## 2020-02-12 DIAGNOSIS — I2510 Atherosclerotic heart disease of native coronary artery without angina pectoris: Secondary | ICD-10-CM | POA: Diagnosis not present

## 2020-02-12 DIAGNOSIS — I10 Essential (primary) hypertension: Secondary | ICD-10-CM | POA: Diagnosis not present

## 2020-02-12 DIAGNOSIS — G720 Drug-induced myopathy: Secondary | ICD-10-CM

## 2020-02-12 DIAGNOSIS — T466X5A Adverse effect of antihyperlipidemic and antiarteriosclerotic drugs, initial encounter: Secondary | ICD-10-CM

## 2020-02-12 NOTE — Patient Instructions (Signed)
Medication Instructions:  The current medical regimen is effective;  continue present plan and medications.  *If you need a refill on your cardiac medications before your next appointment, please call your pharmacy*   Follow-Up: At [facility], you and your health needs are our priority.  As part of our continuing mission to provide you with exceptional heart care, we have created designated Provider Care Teams.  These Care Teams include your primary Cardiologist (physician) and Advanced Practice Providers (APPs -  Physician Assistants and Nurse Practitioners) who all work together to provide you with the care you need, when you need it.  We recommend signing up for the patient portal called "MyChart".  Sign up information is provided on this After Visit Summary.  MyChart is used to connect with patients for Virtual Visits (Telemedicine).  Patients are able to view lab/test results, encounter notes, upcoming appointments, etc.  Non-urgent messages can be sent to your provider as well.   To learn more about what you can do with MyChart, go to [email or web address].    Your next appointment:   6 month(s)  The format for your next appointment:   In Person  Provider:   [place] O'Neal, MD      

## 2020-03-11 ENCOUNTER — Telehealth: Payer: Self-pay | Admitting: Cardiovascular Disease

## 2020-03-11 NOTE — Telephone Encounter (Signed)
Spoke with patient.  When placing infuser on skin she did not feel the needle and when it was activated it soaked the waistband of her pants so she does not think she received any of the medication.  Patient will contact her pharmacy to see if it can be refilled at this time and will call back if there are any issues

## 2020-03-11 NOTE — Telephone Encounter (Signed)
Medication Samples have been provided to the patient.  Drug name: Repatha Pushronix       Strength: 11/24/19       Qty: 1  LOT: [id number]  Exp.Date: 08/21  Dosing instructions: Inject once monthly  The patient has been instructed regarding the correct time, dose, and frequency of taking this medication, including desired effects and most common side effects.   Alice Ray 3:46 PM 03/11/2020

## 2020-03-11 NOTE — Telephone Encounter (Signed)
New message     Patient request to talk to the pharmacist because during her repatha infusion, the medication leaked out and she needs advice on what to do next.

## 2020-03-11 NOTE — Telephone Encounter (Signed)
Spoke with patient.  Will pick up Repatha pushtronex sample from [address] location

## 2020-07-21 NOTE — Progress Notes (Deleted)
Cardiology Office Note:   Date:  07/21/2020  NAME:  Alice Ray    MRN: [id number] DOB:  Aug 18, 1965   PCP:  Joya Gaskins, FNP  Cardiologist:  Evalina Field, MD  Electrophysiologist:  None   Referring MD: Joya Gaskins, *   No chief complaint on file. ***  History of Present Illness:   Alice Ray is a 54 y.o. female with a hx of familial hyperlipidemia, CAD, HTN who presents for follow-up.    Problem List 1. Heterozygous familial hypercholesterolemia (LDLR gene positive) -statin intolerant -on repatha  -T chol 176, HDL 82, LDL 85, TG 44 2. CAD -CAC score 105 (96th percentile) -mild (25-49%) LAD plaque  3. HTN  Past Medical History: Past Medical History:  Diagnosis Date  . Anxiety   . Arthritis   . Asthma   . Coronary artery disease   . Heterozygous familial hypercholesterolemia   . Hyperlipidemia   . Hypertension   . Interstitial cystitis   . UTI (urinary tract infection)     Past Surgical History: Past Surgical History:  Procedure Laterality Date  . ANTERIOR FUSION CERVICAL SPINE    . BREAST LUMPECTOMY Right   . CARPAL TUNNEL RELEASE    . CYSTOSCOPY WITH HYDRODISTENSION AND BIOPSY    . HERNIA REPAIR     Rectus Hernia split repair  . HIP FRACTURE SURGERY    . LUMBAR SPINE SURGERY    . SEPTOPLASTY    . VAGINAL HYSTERECTOMY    . VOCAL CORD INJECTION      Current Medications: No outpatient medications have been marked as taking for the 07/25/20 encounter (Appointment) with O'Neal, Cassie Freer, MD.     Allergies:    Penicillins   Social History: Social History   Socioeconomic History  . Marital status: Married    Spouse name: Not on file  . Number of children: 3  . Years of education: Not on file  . Highest education level: Not on file  Occupational History  . Not on file  Tobacco Use  . Smoking status: Former Smoker    Years: 35.00    Types: Cigarettes  . Smokeless tobacco: Never Used   Substance and Sexual Activity  . Alcohol use: Never  . Drug use: Never  . Sexual activity: Not on file  Other Topics Concern  . Not on file  Social History Narrative  . Not on file   Social Determinants of Health   Financial Resource Strain: Not on file  Food Insecurity: Not on file  Transportation Needs: Not on file  Physical Activity: Not on file  Stress: Not on file  Social Connections: Not on file     Family History: The patient's ***family history includes Colitis in her father and paternal aunt; Diverticulosis in her sister; Heart disease in her father.  ROS:   All other ROS reviewed and negative. Pertinent positives noted in the HPI.     EKGs/Labs/Other Studies Reviewed:   The following studies were personally reviewed by me today:  EKG:  EKG is *** ordered today.  The ekg ordered today demonstrates ***, and was personally reviewed by me.   TTE 12/14/2019  IMPRESSION: 1. Coronary calcium score of 105. This was 96th percentile for age and sex matched controls.  2. Normal coronary origin with right dominance.  3. Mild, non-calcified plaque in the mid LAD (25-49%).  4. Diffuse minimal plaque (<25%) in the RCA/LCX (<25%).  5. Small PFO.  RECOMMENDATIONS: 1. Mild non-obstructive  CAD (25-49%). Consider non-atherosclerotic causes of chest pain. Consider preventive therapy and risk factor modification.  Recent Labs: 09/18/2019: ALT 21; Hemoglobin 13.5; Platelets 326.0 11/13/2019: BUN 14; Creatinine, Ser 0.70; Potassium 4.7; Sodium 141   Recent Lipid Panel    Component Value Date/Time   CHOL 176 01/21/2020 0835   TRIG 44 01/21/2020 0835   HDL 82 01/21/2020 0835   CHOLHDL 2.1 01/21/2020 0835   LDLCALC 85 01/21/2020 0835    Physical Exam:   VS:  There were no vitals taken for this visit.   Wt Readings from Last 3 Encounters:  02/12/20 147 lb 12.8 oz (67 kg)  12/10/19 150 lb (68 kg)  11/13/19 148 lb (67.1 kg)    General: Well nourished, well  developed, in no acute distress Head: Atraumatic, normal size  Eyes: PEERLA, EOMI  Neck: Supple, no JVD Endocrine: No thryomegaly Cardiac: Normal S1, S2; RRR; no murmurs, rubs, or gallops Lungs: Clear to auscultation bilaterally, no wheezing, rhonchi or rales  Abd: Soft, nontender, no hepatomegaly  Ext: No edema, pulses 2+ Musculoskeletal: No deformities, BUE and BLE strength normal and equal Skin: Warm and dry, no rashes   Neuro: Alert and oriented to person, place, time, and situation, CNII-XII grossly intact, no focal deficits  Psych: Normal mood and affect   ASSESSMENT:   Alice Ray is a 54 y.o. female who presents for the following: No diagnosis found.  PLAN:   There are no diagnoses linked to this encounter.  Disposition: No follow-ups on file.  Medication Adjustments/Labs and Tests Ordered: Current medicines are reviewed at length with the patient today.  Concerns regarding medicines are outlined above.  No orders of the defined types were placed in this encounter.  No orders of the defined types were placed in this encounter.   There are no Patient Instructions on file for this visit.   Time Spent with Patient: I have spent a total of *** minutes with patient reviewing hospital notes, telemetry, EKGs, labs and examining the patient as well as establishing an assessment and plan that was discussed with the patient.  > 50% of time was spent in direct patient care.  Signed, Addison Naegeli. Audie Box, [place]  [address], [place], [place] [address] [phone]  07/21/2020 10:13 AM

## 2020-07-25 ENCOUNTER — Ambulatory Visit: Payer: BC Managed Care – PPO | Admitting: Cardiovascular Disease

## 2020-07-26 DIAGNOSIS — Z8616 Personal history of COVID-19: Secondary | ICD-10-CM

## 2020-07-26 HISTORY — DX: Personal history of COVID-19: Z86.16

## 2020-09-06 NOTE — Progress Notes (Deleted)
{Choose 1 Note Type (Telehealth Visit or Telephone Visit):[id number]} Virtual platform was offered given ongoing worsening Covid-19 pandemic.  Date:  09/06/2020   ID:  Alice Ray, DOB [birth year], MRN [id number]  Patient Location: Home Provider Location: [facility]  PCP:  Joya Gaskins, FNP  Cardiologist:  Evalina Field, MD  Electrophysiologist:  None   Evaluation Performed:  Follow-Up Visit  Chief Complaint:  Follow-up of heterozygous familial hypercholesterolemia   History of Present Illness:    Alice Ray is a 55 y.o. female with a history of mild non-obstructive CAD on coronary CTA in 12/2019, hypertension, heterozygous familial hypercholesterolemia, anxiety, and asthma who is followed by Dr. Audie Box and presents today for follow-up.  Patient was referred to Dr. Audie Box in 11/2019 for evaluation of neck/ chest pain. She reported intermittent episodes of intense neck pain that would radiate to her jaw and chest for the past few years. Mainly occurred at rest. Patient reported being very active and was training to [place]. Coronary CTA was ordered for further evaluation and showed coronary calcium score of 105 (96th percentile for age and sex) and mild, non-calcified plaque in the mid LAD (25-49%) with diffuse minimal plaque (<25%) in the RCA and LCX. She also complained of bilateral leg pain that she described as a burning. She had absent pulses in her lower extremities. Lower extremity dopplers with ABIs were ordered and were normal. A lipid panel from the prior year showed LDL of 248. She underwent genetic testing which confirmed heterozygous familiar hypercholesterolemia. Her two children also tested positive for this. She was started on Repatha with improvement in LDL to 85 on lipid panel in 01/2020.   Patient was last seen by Dr. Audie Box in 02/2020 at which time she was doing well from a cardiac standpoint. She was remaining very active and  still training to [place] in early 2022. She did report leg pain but this was not limiting her activity. She was still walking 5 miles per day.   Patient presents today for a follow-up visit via ***.   Heterozygous Familial Hypercholesterolemia - Genetically positive for heterozygous familial hypercholesterolemia. Intolerant to daily *** statin use. Started on Repatha with significant improvement in LDL. - Most recent lipid panel in 08/2019: Total Cholesterol Total Cholesterol 211, Triglycerides 97, HDL 81, LDL 106. - Continue Repatha and Crestor 5mg  every other day *** - Will need repeat lipid panel in 01/2021. ***  Non-Obstructive CAD  - Noted on coronary CTA in 12/2019. Calcium score of 105 (96th percentile for age and sex) at that time.  - No angina. *** - Continue aggressive control of hyperlipidemia as above.  Hypertension - *** - Continue Losartan *** daily and Toprol-XL 25mg  daily.    Past Medical History:  Diagnosis Date  . Anxiety   . Arthritis   . Asthma   . Coronary artery disease   . Heterozygous familial hypercholesterolemia   . Hyperlipidemia   . Hypertension   . Interstitial cystitis   . UTI (urinary tract infection)    Past Surgical History:  Procedure Laterality Date  . ANTERIOR FUSION CERVICAL SPINE    . BREAST LUMPECTOMY Right   . CARPAL TUNNEL RELEASE    . CYSTOSCOPY WITH HYDRODISTENSION AND BIOPSY    . HERNIA REPAIR     Rectus Hernia split repair  . HIP FRACTURE SURGERY    . LUMBAR SPINE SURGERY    . SEPTOPLASTY    . VAGINAL HYSTERECTOMY    .  VOCAL CORD INJECTION       No outpatient medications have been marked as taking for the 09/13/20 encounter (Appointment) with Darreld Mclean, PA-C.     Allergies:   Penicillins   Social History   Tobacco Use  . Smoking status: Former Smoker    Years: 35.00    Types: Cigarettes  . Smokeless tobacco: Never Used  Substance Use Topics  . Alcohol use: Never  . Drug use: Never      Family Hx: The patient's family history includes Colitis in her father and paternal aunt; Diverticulosis in her sister; Heart disease in her father.  ROS:   Please see the history of present illness.    All other systems reviewed and are negative.   Prior CV studies:    The following studies were reviewed today:   Lower Extremity Arterial Dopplers/ ABIs 11/18/2019: Summary:  Right: Resting right ankle-brachial index is within normal range. No  evidence of significant right lower extremity arterial disease. The right  toe-brachial index is normal.   Left: Resting left ankle-brachial index is within normal range. No  evidence of significant left lower extremity arterial disease. The left  toe-brachial index is borderline abnormal.  _______________  Coronary CTA 12/14/2019: Impressions: 1. Coronary calcium score of 105. This was 96th percentile for age and sex matched controls.  2. Normal coronary origin with right dominance.  3. Mild, non-calcified plaque in the mid LAD (25-49%).  4. Diffuse minimal plaque (<25%) in the RCA/LCX (<25%).  5. Small PFO.  Recommendations: 1. Mild non-obstructive CAD (25-49%). Consider non-atherosclerotic causes of chest pain. Consider preventive therapy and risk factor modification.  Labs/Other Tests and Data Reviewed:    EKG: Most recent EKG from 11/13/2019 personally reviewed and demonstrates: Sinus bradycardia, rate 57 bpm, with possible anteroseptal infarct but no acute ischemic changes.  Recent Labs: 09/18/2019: ALT 21; Hemoglobin 13.5; Platelets 326.0 11/13/2019: BUN 14; Creatinine, Ser 0.70; Potassium 4.7; Sodium 141   Recent Lipid Panel Lab Results  Component Value Date/Time   CHOL 176 01/21/2020 08:35 AM   TRIG 44 01/21/2020 08:35 AM   HDL 82 01/21/2020 08:35 AM   CHOLHDL 2.1 01/21/2020 08:35 AM   LDLCALC 85 01/21/2020 08:35 AM    Wt Readings from Last 3 Encounters:  02/12/20 147 lb 12.8 oz (67 kg)  12/10/19 150 lb  (68 kg)  11/13/19 148 lb (67.1 kg)     Objective:    Vital Signs:  There were no vitals taken for this visit.   VS Reviewed. General: No acute distress. Pulm: No labored breathing. No coughing during visit. No audible wheezing. Speaking in full sentences. Neuro: Alert and oriented. No slurred speech. Answers questions appropriately. Psych: Pleasant affect.  ASSESSMENT & PLAN:    1. ***  COVID-19 Education: The signs and symptoms of COVID-19 were discussed with the patient and how to seek care for testing (follow up with PCP or arrange E-visit).  The importance of social distancing was discussed today.  Time:   Today, I have spent *** minutes with the patient with telehealth technology discussing the above problems.     Medication Adjustments/Labs and Tests Ordered: Current medicines are reviewed at length with the patient today.  Concerns regarding medicines are outlined above.   Follow Up:  {F/U [phone]} {follow up:15908}  Signed, Eppie Gibson  09/06/2020 4:49 PM    [facility]

## 2020-09-13 ENCOUNTER — Telehealth: Payer: BC Managed Care – PPO | Admitting: Student

## 2020-09-26 ENCOUNTER — Encounter: Payer: Self-pay | Admitting: Cardiology

## 2020-09-26 ENCOUNTER — Telehealth (INDEPENDENT_AMBULATORY_CARE_PROVIDER_SITE_OTHER): Payer: BC Managed Care – PPO | Admitting: Cardiology

## 2020-09-26 VITALS — BP 116/86 | HR 75 | Ht 68.5 in | Wt 142.0 lb

## 2020-09-26 DIAGNOSIS — I10 Essential (primary) hypertension: Secondary | ICD-10-CM

## 2020-09-26 DIAGNOSIS — T466X5S Adverse effect of antihyperlipidemic and antiarteriosclerotic drugs, sequela: Secondary | ICD-10-CM

## 2020-09-26 DIAGNOSIS — E7801 Familial hypercholesterolemia: Secondary | ICD-10-CM

## 2020-09-26 DIAGNOSIS — G720 Drug-induced myopathy: Secondary | ICD-10-CM | POA: Diagnosis not present

## 2020-09-26 DIAGNOSIS — I2510 Atherosclerotic heart disease of native coronary artery without angina pectoris: Secondary | ICD-10-CM | POA: Insufficient documentation

## 2020-09-26 DIAGNOSIS — E782 Mixed hyperlipidemia: Secondary | ICD-10-CM

## 2020-09-26 DIAGNOSIS — T466X5A Adverse effect of antihyperlipidemic and antiarteriosclerotic drugs, initial encounter: Secondary | ICD-10-CM

## 2020-09-26 NOTE — Patient Instructions (Addendum)
Medication Instructions:  Continue current medications  *If you need a refill on your cardiac medications before your next appointment, please call your pharmacy*   Lab Work: Fasting Lipids in June   Testing/Procedures: None Ordered   Follow-Up: At [facility], you and your health needs are our priority.  As part of our continuing mission to provide you with exceptional heart care, we have created designated Provider Care Teams.  These Care Teams include your primary Cardiologist (physician) and Advanced Practice Providers (APPs -  Physician Assistants and Nurse Practitioners) who all work together to provide you with the care you need, when you need it.  We recommend signing up for the patient portal called "MyChart".  Sign up information is provided on this After Visit Summary.  MyChart is used to connect with patients for Virtual Visits (Telemedicine).  Patients are able to view lab/test results, encounter notes, upcoming appointments, etc.  Non-urgent messages can be sent to your provider as well.   To learn more about what you can do with MyChart, go to [email or web address].    Your next appointment:   4 month(s)  The format for your next appointment:   In Person  Provider:   You may see Evalina Field, MD or one of the following Advanced Practice Providers on your designated Care Team:    Almyra Deforest, PA-C  Fabian Sharp, PA-C or   Roby Lofts, [place]

## 2020-09-26 NOTE — Progress Notes (Signed)
Virtual Visit via Telephone Note   This visit type was conducted due to national recommendations for restrictions regarding the COVID-19 Pandemic (e.g. social distancing) in an effort to limit this patient's exposure and mitigate transmission in our community.  Due to her co-morbid illnesses, this patient is at least at moderate risk for complications without adequate follow up.  This format is felt to be most appropriate for this patient at this time.  The patient did not have access to video technology/had technical difficulties with video requiring transitioning to audio format only (telephone).  All issues noted in this document were discussed and addressed.  No physical exam could be performed with this format.  Please refer to the patient's chart for her  consent to telehealth for [facility].    Date:  09/26/2020   ID:  Alice Ray, DOB 26-May-1966, MRN [id number] The patient was identified using 2 identifiers.  Patient Location: Home Provider Location: Home Office   PCP:  Joya Gaskins, [place]  Cardiologist:  Evalina Field, MD  Advanced Practice Provider:  No care team member to display Electrophysiologist:  None   Evaluation Performed:  Follow-Up Visit  Chief Complaint:  none  History of Present Illness:    Alice Ray is a 55 y.o. female with familial hyperlipidemia and nonobstructive coronary disease.  She was last seen by Dr. Audie Box July 2021.  She was contacted today for routine follow-up.  The patient was supposed to climb [place] earlier this year.  Unfortunately she contracted COVID in December.  She is back in training for this now.  She plans to attempt this again in September 2022.  She is very physically active, she walks a least 4 miles a day and concentrates on hills.  She has had occasional sharp shooting left-sided chest pain but no exertional chest pressure or shortness of breath.  She has  statin intolerance and is on Repatha and Crestor twice a week.  She has hypertension, her blood pressures been controlled.  The patient does not have symptoms concerning for COVID-19 infection (fever, chills, cough, or new shortness of breath).    Past Medical History:  Diagnosis Date  . Anxiety   . Arthritis   . Asthma   . Coronary artery disease   . Heterozygous familial hypercholesterolemia   . Hyperlipidemia   . Hypertension   . Interstitial cystitis   . UTI (urinary tract infection)    Past Surgical History:  Procedure Laterality Date  . ANTERIOR FUSION CERVICAL SPINE    . BREAST LUMPECTOMY Right   . CARPAL TUNNEL RELEASE    . CYSTOSCOPY WITH HYDRODISTENSION AND BIOPSY    . HERNIA REPAIR     Rectus Hernia split repair  . HIP FRACTURE SURGERY    . LUMBAR SPINE SURGERY    . SEPTOPLASTY    . VAGINAL HYSTERECTOMY    . VOCAL CORD INJECTION       Current Meds  Medication Sig  . albuterol (VENTOLIN HFA) 108 (90 Base) MCG/ACT inhaler Inhale 2 puffs into the lungs as needed for wheezing or shortness of breath.  . diclofenac Sodium (VOLTAREN) 1 % GEL Apply 1 application topically 4 (four) times daily as needed (joint pain).   Marland Kitchen EPINEPHrine 0.3 mg/0.3 mL IJ SOAJ injection Inject 0.3 mg into the muscle Once PRN for anaphylaxis.  . Evolocumab with Infusor ([place]) 420 MG/3.5ML SOCT Inject 420 mg into the skin every 30 (thirty)  days.  Marland Kitchen LORazepam (ATIVAN) 1 MG tablet Take 1 mg by mouth daily as needed for anxiety.   Marland Kitchen losartan (COZAAR) 100 MG tablet Take 50 mg by mouth daily.  . metoprolol succinate (TOPROL-XL) 25 MG 24 hr tablet Take 25 mg by mouth every evening.  . montelukast (SINGULAIR) 10 MG tablet Take 10 mg by mouth daily as needed (allergies, asthma).   . pantoprazole (PROTONIX) 40 MG tablet Take 40 mg by mouth every morning.  . rosuvastatin (CRESTOR) 5 MG tablet Take 5 mg by mouth. Monday and Friday  . tiaGABine (GABITRIL) 4 MG tablet Take 4 mg by mouth  at bedtime.  . [DISCONTINUED] rosuvastatin (CRESTOR) 5 MG tablet Take 1 tablet (5 mg total) by mouth daily.     Allergies:   Penicillins   Social History   Tobacco Use  . Smoking status: Former Smoker    Years: 35.00    Types: Cigarettes  . Smokeless tobacco: Never Used  Substance Use Topics  . Alcohol use: Never  . Drug use: Never     Family Hx: The patient's family history includes Colitis in her father and paternal aunt; Diverticulosis in her sister; Heart disease in her father.  ROS:   Please see the history of present illness.    All other systems reviewed and are negative.   Prior CV studies:   The following studies were reviewed today:  Coronary CTA 12/14/2019- IMPRESSION: 1. Coronary calcium score of 105. This was 96th percentile for age and sex matched controls.  2. Normal coronary origin with right dominance.  3. Mild, non-calcified plaque in the mid LAD (25-49%).  4. Diffuse minimal plaque (<25%) in the RCA/LCX (<25%).  5. Small PFO.  RECOMMENDATIONS: 1. Mild non-obstructive CAD (25-49%). Consider non-atherosclerotic causes of chest pain. Consider preventive therapy and risk factor modification.  Eleonore Chiquito, MD  Labs/Other Tests and Data Reviewed:    EKG:  An ECG dated 11/13/2019 was personally reviewed today and demonstrated:  NSR  Recent Labs: 11/13/2019: BUN 14; Creatinine, Ser 0.70; Potassium 4.7; Sodium 141   Recent Lipid Panel Lab Results  Component Value Date/Time   CHOL 176 01/21/2020 08:35 AM   TRIG 44 01/21/2020 08:35 AM   HDL 82 01/21/2020 08:35 AM   CHOLHDL 2.1 01/21/2020 08:35 AM   LDLCALC 85 01/21/2020 08:35 AM    Wt Readings from Last 3 Encounters:  09/26/20 142 lb (64.4 kg)  02/12/20 147 lb 12.8 oz (67 kg)  12/10/19 150 lb (68 kg)     Risk Assessment/Calculations:      Objective:    Vital Signs:  BP 116/86   Pulse 75   Ht 5' 8.5" (1.74 m)   Wt 142 lb (64.4 kg)   BMI 21.28 kg/m    VITAL SIGNS:   reviewed  ASSESSMENT & PLAN:    HTN- Controlled  Heterozygous familial hypercholesterolemia On Repatha and Crestor- lipids due in June  CAD- Mild plaque- asymptomatic        COVID-19 Education: The signs and symptoms of COVID-19 were discussed with the patient and how to seek care for testing (follow up with PCP or arrange E-visit).  The importance of social distancing was discussed today.  Time:   Today, I have spent 15 minutes with the patient with telehealth technology discussing the above problems.     Medication Adjustments/Labs and Tests Ordered: Current medicines are reviewed at length with the patient today.  Concerns regarding medicines are outlined above.   Tests Ordered: No  orders of the defined types were placed in this encounter.   Medication Changes: No orders of the defined types were placed in this encounter.   Follow Up:  In Person Dr Audie Box in July  Angelena Form, Hershal Coria  09/26/2020 10:29 AM    [place]

## 2020-11-09 ENCOUNTER — Encounter (HOSPITAL_BASED_OUTPATIENT_CLINIC_OR_DEPARTMENT_OTHER): Payer: Self-pay

## 2020-12-25 ENCOUNTER — Other Ambulatory Visit: Payer: Self-pay | Admitting: Cardiovascular Disease

## 2020-12-30 ENCOUNTER — Encounter (HOSPITAL_BASED_OUTPATIENT_CLINIC_OR_DEPARTMENT_OTHER): Payer: Self-pay

## 2021-01-19 LAB — LIPID PANEL
Chol/HDL Ratio: 2.5 ratio (ref 0.0–4.4)
Cholesterol, Total: 201 mg/dL — ABNORMAL HIGH (ref 100–199)
HDL: 79 mg/dL (ref >39)
LDL Chol Calc (NIH): 111 mg/dL — ABNORMAL HIGH (ref 0–99)
Triglycerides: 60 mg/dL (ref 0–149)
VLDL Cholesterol Cal: 11 mg/dL (ref 5–40)

## 2021-01-29 NOTE — Progress Notes (Signed)
[facility]  Cardiology Office Note:    Date:  01/30/2021  NAME:  Alice Ray    MRN: [id number] DOB:  [birth year]   PCP:  Joya Gaskins, FNP  Cardiologist:  Evalina Field, MD   Referring MD: Joya Gaskins, *   Chief Complaint  Patient presents with   Follow-up    History of Present Illness:    Alice Ray is a 55 y.o. female with a hx of heterozygous familial hyperlipidemia, hypertension, nonobstructive CAD who presents for follow-up.  On Repatha.  LDL still not at goal.  She reports she is doing well.  She is still walking 5 miles per day.  She does have plans to go to [place] and climb [facility].  Apparently she was supposed to have gone this summer but there were issues with her COVID status that precluded this.  She was actually not able to get into [place].  She reports she has plans to go back in September.  She reports she was having some episodes of heart racing.  They occurred several months ago.  They lasted once a day.  She reports they are resolved.  She does not have any symptoms in the past few weeks.  No chest pain or shortness of breath.  We did discuss her most recent lipid profile.  LDL 111.  This has climbed up.  She is on Crestor on Mondays and Fridays.  She is on Repatha.  We discussed adding Zetia.  She is okay to do this.  No need to recheck.  She has heterozygous familial hyper cholesterolemia.  Her grandchildren have been checked.  She has several who have this condition.  They are monitoring this.  Blood pressure is 102/80.  Well-controlled.  She overall is doing remarkably well.  Problem List 1. Heterozygous familial hypercholesterolemia -LDLR positive  -statin intolerant -on repatha -T chol 201, HDL 79, LDL 111, TG 60 2. CAD -CAC score 105 (96th percentile) -mild (25-49%) LAD plaque 3. HTN  Past Medical History: Past Medical History:  Diagnosis Date   Anxiety     Arthritis    Asthma    Coronary artery disease    Heterozygous familial hypercholesterolemia    Hyperlipidemia    Hypertension    Interstitial cystitis    UTI (urinary tract infection)     Past Surgical History: Past Surgical History:  Procedure Laterality Date   ANTERIOR FUSION CERVICAL SPINE     BREAST LUMPECTOMY Right    CARPAL TUNNEL RELEASE     CYSTOSCOPY WITH HYDRODISTENSION AND BIOPSY     HERNIA REPAIR     Rectus Hernia split repair   HIP FRACTURE SURGERY     LUMBAR SPINE SURGERY     SEPTOPLASTY     VAGINAL HYSTERECTOMY     VOCAL CORD INJECTION      Current Medications: Current Meds  Medication Sig   albuterol (VENTOLIN HFA) 108 (90 Base) MCG/ACT inhaler Inhale 2 puffs into the lungs as needed for wheezing or shortness of breath.   diclofenac Sodium (VOLTAREN) 1 % GEL Apply 1 application topically 4 (four) times daily as needed (joint pain).    EPINEPHrine 0.3 mg/0.3 mL IJ SOAJ injection Inject 0.3 mg into the muscle Once PRN for anaphylaxis.   ezetimibe (ZETIA) 10 MG tablet Take 1 tablet (10 mg total) by mouth daily.   LORazepam (ATIVAN) 1 MG tablet Take 1 mg by mouth daily as needed for  anxiety.    losartan (COZAAR) 100 MG tablet Take 50 mg by mouth daily.   metoprolol succinate (TOPROL-XL) 25 MG 24 hr tablet Take 25 mg by mouth every evening.   montelukast (SINGULAIR) 10 MG tablet Take 10 mg by mouth daily as needed (allergies, asthma).    pantoprazole (PROTONIX) 40 MG tablet Take 40 mg by mouth every morning.   [place] 420 MG/3.5ML SOCT INJECT 420 MG INTO THE SKIN EVERY 30 (THIRTY) DAYS.   rosuvastatin (CRESTOR) 5 MG tablet Take 5 mg by mouth. Monday and Friday     Allergies:    Penicillins   Social History: Social History   Socioeconomic History   Marital status: Married    Spouse name: Not on file   Number of children: 3   Years of education: Not on file   Highest education level: Not on file  Occupational History   Not on file   Tobacco Use   Smoking status: Former    Years: 35.00    Pack years: 0.00    Types: Cigarettes   Smokeless tobacco: Never  Substance and Sexual Activity   Alcohol use: Never   Drug use: Never   Sexual activity: Not on file  Other Topics Concern   Not on file  Social History Narrative   Not on file   Social Determinants of Health   Financial Resource Strain: Not on file  Food Insecurity: Not on file  Transportation Needs: Not on file  Physical Activity: Not on file  Stress: Not on file  Social Connections: Not on file    Family History: The patient's family history includes Colitis in her father and paternal aunt; Diverticulosis in her sister; Heart disease in her father.  ROS:   All other ROS reviewed and negative. Pertinent positives noted in the HPI.    EKGs/Labs/Other Studies Reviewed:   The following studies were personally reviewed by me today:  EKG:  EKG is ordered today.  The ekg ordered today demonstrates normal sinus rhythm heart rate 68, nonspecific ST-T changes noted, and was personally reviewed by me.   CCTA 12/14/2019  IMPRESSION: 1. Coronary calcium score of 105. This was 96th percentile for age and sex matched controls.   2. Normal coronary origin with right dominance.   3. Mild, non-calcified plaque in the mid LAD (25-49%).   4. Diffuse minimal plaque (<25%) in the RCA/LCX (<25%).   5. Small PFO.  Recent Labs: No results found for requested labs within last 8760 hours.   Recent Lipid Panel    Component Value Date/Time   CHOL 201 (H) 01/19/2021 1006   TRIG 60 01/19/2021 1006   HDL 79 01/19/2021 1006   CHOLHDL 2.5 01/19/2021 1006   LDLCALC 111 (H) 01/19/2021 1006    Physical Exam:   VS:  BP 102/80   Pulse 68   Ht 5' 8.5" (1.74 m)   Wt 148 lb (67.1 kg)   BMI 22.18 kg/m    Wt Readings from Last 3 Encounters:  01/30/21 148 lb (67.1 kg)  09/26/20 142 lb (64.4 kg)  02/12/20 147 lb 12.8 oz (67 kg)    General: Well nourished, well  developed, in no acute distress Head: Atraumatic, normal size  Eyes: PEERLA, EOMI  Neck: Supple, no JVD Endocrine: No thryomegaly Cardiac: Normal S1, S2; RRR; no murmurs, rubs, or gallops Lungs: Clear to auscultation bilaterally, no wheezing, rhonchi or rales  Abd: Soft, nontender, no hepatomegaly  Ext: No edema, pulses 2+ Musculoskeletal: No  deformities, BUE and BLE strength normal and equal Skin: Warm and dry, no rashes   Neuro: Alert and oriented to person, place, time, and situation, CNII-XII grossly intact, no focal deficits  Psych: Normal mood and affect   ASSESSMENT:   Alice Ray is a 55 y.o. female who presents for the following: 1. Heterozygous familial hypercholesterolemia   2. Statin myopathy   3. Coronary artery disease involving native coronary artery of native heart without angina pectoris   4. Agatston coronary artery calcium score between 100 and 199   5. Essential hypertension     PLAN:   1. Heterozygous familial hypercholesterolemia 2. Statin myopathy 3. Coronary artery disease involving native coronary artery of native heart without angina pectoris 4. Agatston coronary artery calcium score between 100 and 199 -She is genetically positive LDL are positive for familial hypercholesterolemia.  She is heterozygous for this condition.  She has had several family members checked for this condition.  She is on Repatha.  She only tolerates a statin on Mondays and Fridays.  We will continue Crestor 5 mg on Mondays and Fridays.  Most recent LDL cholesterol 111.  Still not at goal.  We discussed adding Zetia 10 mg daily.  She is okay to do this.  I really think this is the best we can do.  It is much better than it was.  Her LDL cholesterol was clearly above 200 in the past.  She does have elevated coronary calcium score with nonobstructive CAD.  No symptoms of angina.  She is walking 5 miles per day.  She has plans to [place] later this year.  She is  doing her part.  Unfortunately this is genetically mediated elevated cholesterol.  5. Essential hypertension -Blood pressure goal less than 130/80.  She is well controlled today.  No changes to medications.  She will continue losartan 100 mg daily.  Disposition: Return in about 1 year (around 01/30/2022).  Medication Adjustments/Labs and Tests Ordered: Current medicines are reviewed at length with the patient today.  Concerns regarding medicines are outlined above.  Orders Placed This Encounter  Procedures   EKG 12-Lead   EKG 12-Lead   Meds ordered this encounter  Medications   ezetimibe (ZETIA) 10 MG tablet    Sig: Take 1 tablet (10 mg total) by mouth daily.    Dispense:  90 tablet    Refill:  3     Patient Instructions  Medication Instructions:  Start Zetia 10 mg daily   *If you need a refill on your cardiac medications before your next appointment, please call your pharmacy*    Follow-Up: At [facility], you and your health needs are our priority.  As part of our continuing mission to provide you with exceptional heart care, we have created designated Provider Care Teams.  These Care Teams include your primary Cardiologist (physician) and Advanced Practice Providers (APPs -  Physician Assistants and Nurse Practitioners) who all work together to provide you with the care you need, when you need it.  We recommend signing up for the patient portal called "MyChart".  Sign up information is provided on this After Visit Summary.  MyChart is used to connect with patients for Virtual Visits (Telemedicine).  Patients are able to view lab/test results, encounter notes, upcoming appointments, etc.  Non-urgent messages can be sent to your provider as well.   To learn more about what you can do with MyChart, go to [email or web address].    Your next appointment:  12 month(s)  The format for your next appointment:   In Person  Provider:   Eleonore Chiquito, MD     Time Spent  with Patient: I have spent a total of 35 minutes with patient reviewing hospital notes, telemetry, EKGs, labs and examining the patient as well as establishing an assessment and plan that was discussed with the patient.  > 50% of time was spent in direct patient care.  Signed, Addison Naegeli. Audie Box, MD, [place]  [address] [place], [place] [address] [phone]  01/30/2021 12:47 PM

## 2021-01-30 ENCOUNTER — Ambulatory Visit (INDEPENDENT_AMBULATORY_CARE_PROVIDER_SITE_OTHER): Payer: BC Managed Care – PPO | Admitting: Cardiovascular Disease

## 2021-01-30 ENCOUNTER — Other Ambulatory Visit: Payer: Self-pay

## 2021-01-30 ENCOUNTER — Encounter (HOSPITAL_BASED_OUTPATIENT_CLINIC_OR_DEPARTMENT_OTHER): Payer: Self-pay | Admitting: Cardiovascular Disease

## 2021-01-30 VITALS — BP 102/80 | HR 68 | Ht 68.5 in | Wt 148.0 lb

## 2021-01-30 DIAGNOSIS — G720 Drug-induced myopathy: Secondary | ICD-10-CM | POA: Diagnosis not present

## 2021-01-30 DIAGNOSIS — R931 Abnormal findings on diagnostic imaging of heart and coronary circulation: Secondary | ICD-10-CM | POA: Diagnosis not present

## 2021-01-30 DIAGNOSIS — T466X5A Adverse effect of antihyperlipidemic and antiarteriosclerotic drugs, initial encounter: Secondary | ICD-10-CM

## 2021-01-30 DIAGNOSIS — I2510 Atherosclerotic heart disease of native coronary artery without angina pectoris: Secondary | ICD-10-CM | POA: Diagnosis not present

## 2021-01-30 DIAGNOSIS — E7801 Familial hypercholesterolemia: Secondary | ICD-10-CM | POA: Diagnosis not present

## 2021-01-30 DIAGNOSIS — I10 Essential (primary) hypertension: Secondary | ICD-10-CM

## 2021-01-30 MED ORDER — EZETIMIBE 10 MG PO TABS: 10.0000 mg | ORAL_TABLET | Freq: Every day | ORAL | 3 refills | Status: DC

## 2021-01-30 NOTE — Patient Instructions (Signed)
Medication Instructions:  Start Zetia 10 mg daily   *If you need a refill on your cardiac medications before your next appointment, please call your pharmacy*    Follow-Up: At [facility], you and your health needs are our priority.  As part of our continuing mission to provide you with exceptional heart care, we have created designated Provider Care Teams.  These Care Teams include your primary Cardiologist (physician) and Advanced Practice Providers (APPs -  Physician Assistants and Nurse Practitioners) who all work together to provide you with the care you need, when you need it.  We recommend signing up for the patient portal called "MyChart".  Sign up information is provided on this After Visit Summary.  MyChart is used to connect with patients for Virtual Visits (Telemedicine).  Patients are able to view lab/test results, encounter notes, upcoming appointments, etc.  Non-urgent messages can be sent to your provider as well.   To learn more about what you can do with MyChart, go to [email or web address].    Your next appointment:   12 month(s)  The format for your next appointment:   In Person  Provider:   Eleonore Chiquito, MD

## 2021-02-15 ENCOUNTER — Ambulatory Visit: Payer: Self-pay | Admitting: General Surgery

## 2021-02-15 MED ORDER — DEXTROSE 5 % IV SOLN: 900.0000 mg | INTRAVENOUS | Status: AC

## 2021-02-15 MED ORDER — GENTAMICIN SULFATE 40 MG/ML IJ SOLN: 5.0000 mg/kg | INTRAVENOUS | Status: AC

## 2021-03-02 ENCOUNTER — Other Ambulatory Visit: Payer: Self-pay

## 2021-03-02 MED ORDER — REPATHA PUSHTRONEX SYSTEM 420 MG/3.5ML ~~LOC~~ SOCT
420.0000 mg | SUBCUTANEOUS | 3 refills | Status: DC
Start: 2021-03-02 — End: 2022-02-26

## 2021-03-13 ENCOUNTER — Encounter (HOSPITAL_BASED_OUTPATIENT_CLINIC_OR_DEPARTMENT_OTHER): Payer: Self-pay | Admitting: General Surgery

## 2021-03-14 ENCOUNTER — Encounter (HOSPITAL_BASED_OUTPATIENT_CLINIC_OR_DEPARTMENT_OTHER): Payer: Self-pay | Admitting: General Surgery

## 2021-03-14 ENCOUNTER — Other Ambulatory Visit: Payer: Self-pay

## 2021-03-14 NOTE — Progress Notes (Signed)
Spoke w/ via phone for pre-op interview--- Pt Lab needs dos----   Istat            Lab results------ current ekg in epic/ chart COVID test -----patient states asymptomatic no test needed Arrive at ------- 0730 on 03-17-2021 NPO after MN NO Solid Food.  Clear liquids from MN until--- 0630 Med rec completed Medications to take morning of surgery ----- Protonix, Singulair, Zetia Diabetic medication ----- n/a Patient instructed no nail polish to be worn day of surgery Patient instructed to bring photo id and insurance card day of surgery Patient aware to have Driver (ride ) / caregiver for 24 hours after surgery --husband, Alice Ray Patient Special Instructions ----- pt asked to bring rescue inhaler and spinal cord stimulator control with her dos Pre-Op special Istructions ----- n/a Patient verbalized understanding of instructions that were given at this phone interview. Patient denies shortness of breath, chest pain, fever, cough at this phone interview.

## 2021-03-17 ENCOUNTER — Other Ambulatory Visit: Payer: Self-pay

## 2021-03-17 ENCOUNTER — Ambulatory Visit (HOSPITAL_BASED_OUTPATIENT_CLINIC_OR_DEPARTMENT_OTHER): Payer: BC Managed Care – PPO | Admitting: Certified Registered Nurse Anesthetist

## 2021-03-17 ENCOUNTER — Encounter (HOSPITAL_BASED_OUTPATIENT_CLINIC_OR_DEPARTMENT_OTHER)
Admission: RE | Disposition: A | Discharge status: Home / Self Care | Payer: Self-pay | Source: Home / Self Care | Attending: General Surgery

## 2021-03-17 ENCOUNTER — Encounter (HOSPITAL_BASED_OUTPATIENT_CLINIC_OR_DEPARTMENT_OTHER): Payer: Self-pay | Admitting: General Surgery

## 2021-03-17 ENCOUNTER — Ambulatory Visit (HOSPITAL_BASED_OUTPATIENT_CLINIC_OR_DEPARTMENT_OTHER)
Admission: RE | Admit: 2021-03-17 | Discharge: 2021-03-17 | Disposition: A | Discharge status: Home / Self Care | Payer: BC Managed Care – PPO | Attending: General Surgery | Admitting: General Surgery

## 2021-03-17 DIAGNOSIS — Z87891 Personal history of nicotine dependence: Secondary | ICD-10-CM | POA: Diagnosis not present

## 2021-03-17 DIAGNOSIS — Z8249 Family history of ischemic heart disease and other diseases of the circulatory system: Secondary | ICD-10-CM | POA: Diagnosis not present

## 2021-03-17 DIAGNOSIS — D1721 Benign lipomatous neoplasm of skin and subcutaneous tissue of right arm: Secondary | ICD-10-CM | POA: Diagnosis present

## 2021-03-17 DIAGNOSIS — Z8379 Family history of other diseases of the digestive system: Secondary | ICD-10-CM | POA: Insufficient documentation

## 2021-03-17 DIAGNOSIS — Z8616 Personal history of COVID-19: Secondary | ICD-10-CM | POA: Diagnosis not present

## 2021-03-17 DIAGNOSIS — D179 Benign lipomatous neoplasm, unspecified: Secondary | ICD-10-CM | POA: Diagnosis not present

## 2021-03-17 HISTORY — DX: Personal history of other diseases of the respiratory system: Z87.09

## 2021-03-17 HISTORY — DX: Personal history of peptic ulcer disease: Z87.11

## 2021-03-17 HISTORY — DX: Other chronic pain: G89.29

## 2021-03-17 HISTORY — DX: Mild intermittent asthma, uncomplicated: J45.20

## 2021-03-17 HISTORY — DX: Gastro-esophageal reflux disease without esophagitis: K21.9

## 2021-03-17 HISTORY — DX: Localized swelling, mass and lump, trunk: R22.2

## 2021-03-17 HISTORY — DX: Interstitial cystitis (chronic) without hematuria: N30.10

## 2021-03-17 HISTORY — DX: Chronic pain syndrome: G89.4

## 2021-03-17 HISTORY — PX: MASS EXCISION: SHX2000

## 2021-03-17 HISTORY — DX: Mixed hyperlipidemia: E78.2

## 2021-03-17 HISTORY — DX: Personal history of other diseases of the digestive system: Z87.19

## 2021-03-17 HISTORY — DX: Presence of other specified functional implants: Z96.89

## 2021-03-17 LAB — POCT I-STAT, CHEM 8
BUN: 19 mg/dL (ref 6–20)
Calcium, Ion: 1.26 mmol/L (ref 1.15–1.40)
Chloride: 104 mmol/L (ref 98–111)
Creatinine, Ser: 0.7 mg/dL (ref 0.44–1.00)
Glucose, Bld: 106 mg/dL — ABNORMAL HIGH (ref 70–99)
HCT: 44 % (ref 36.0–46.0)
Hemoglobin: 15 g/dL (ref 12.0–15.0)
Potassium: 4.2 mmol/L (ref 3.5–5.1)
Sodium: 139 mmol/L (ref 135–145)
TCO2: 25 mmol/L (ref 22–32)

## 2021-03-17 SURGERY — EXCISION MASS
Anesthesia: General | Site: Shoulder | Laterality: Right

## 2021-03-17 MED ORDER — 0.9 % SODIUM CHLORIDE (POUR BTL) OPTIME
TOPICAL | Status: DC | PRN
  Administered 2021-03-17: 500 mL

## 2021-03-17 MED ORDER — ACETAMINOPHEN 10 MG/ML IV SOLN: 1000.0000 mg | Freq: Once | INTRAVENOUS | Status: DC | PRN

## 2021-03-17 MED ORDER — PROMETHAZINE HCL 25 MG/ML IJ SOLN: 6.2500 mg | INTRAMUSCULAR | Status: DC | PRN

## 2021-03-17 MED ORDER — CHLORHEXIDINE GLUCONATE CLOTH 2 % EX PADS: 6.0000 | MEDICATED_PAD | Freq: Once | CUTANEOUS | Status: DC

## 2021-03-17 MED ORDER — DEXMEDETOMIDINE (PRECEDEX) IN NS 20 MCG/5ML (4 MCG/ML) IV SYRINGE
PREFILLED_SYRINGE | INTRAVENOUS | Status: AC
  Filled 2021-03-17: qty 5

## 2021-03-17 MED ORDER — ACETAMINOPHEN 500 MG PO TABS
ORAL_TABLET | ORAL | Status: AC
  Filled 2021-03-17: qty 2

## 2021-03-17 MED ORDER — EPHEDRINE 5 MG/ML INJ
INTRAVENOUS | Status: AC
  Filled 2021-03-17: qty 5

## 2021-03-17 MED ORDER — CLINDAMYCIN PHOSPHATE 600 MG/50ML IV SOLN
INTRAVENOUS | Status: DC | PRN
  Administered 2021-03-17: 600 mg via INTRAVENOUS

## 2021-03-17 MED ORDER — CELECOXIB 200 MG PO CAPS
ORAL_CAPSULE | ORAL | Status: AC
  Filled 2021-03-17: qty 2

## 2021-03-17 MED ORDER — DEXAMETHASONE SODIUM PHOSPHATE 4 MG/ML IJ SOLN
INTRAMUSCULAR | Status: DC | PRN
  Administered 2021-03-17: 10 mg via INTRAVENOUS

## 2021-03-17 MED ORDER — BUPIVACAINE-EPINEPHRINE 0.5% -1:200000 IJ SOLN
INTRAMUSCULAR | Status: DC | PRN
  Administered 2021-03-17 (×2): 5 mL

## 2021-03-17 MED ORDER — ACETAMINOPHEN 160 MG/5ML PO SOLN: 325.0000 mg | ORAL | Status: DC | PRN

## 2021-03-17 MED ORDER — FENTANYL CITRATE (PF) 100 MCG/2ML IJ SOLN
INTRAMUSCULAR | Status: DC | PRN
  Administered 2021-03-17: 50 ug via INTRAVENOUS

## 2021-03-17 MED ORDER — GLYCOPYRROLATE 0.2 MG/ML IJ SOLN
INTRAMUSCULAR | Status: DC | PRN
  Administered 2021-03-17: .2 mg via INTRAVENOUS

## 2021-03-17 MED ORDER — PROPOFOL 10 MG/ML IV BOLUS
INTRAVENOUS | Status: DC | PRN
  Administered 2021-03-17: 200 mg via INTRAVENOUS

## 2021-03-17 MED ORDER — MIDAZOLAM HCL 5 MG/5ML IJ SOLN
INTRAMUSCULAR | Status: DC | PRN
  Administered 2021-03-17: 2 mg via INTRAVENOUS

## 2021-03-17 MED ORDER — ONDANSETRON HCL 4 MG/2ML IJ SOLN
INTRAMUSCULAR | Status: DC | PRN
  Administered 2021-03-17: 4 mg via INTRAVENOUS

## 2021-03-17 MED ORDER — SCOPOLAMINE 1 MG/3DAYS TD PT72: 1.0000 | MEDICATED_PATCH | TRANSDERMAL | Status: DC

## 2021-03-17 MED ORDER — ACETAMINOPHEN 325 MG PO TABS: 325.0000 mg | ORAL_TABLET | ORAL | Status: DC | PRN

## 2021-03-17 MED ORDER — EPHEDRINE SULFATE-NACL 50-0.9 MG/10ML-% IV SOSY
PREFILLED_SYRINGE | INTRAVENOUS | Status: DC | PRN
  Administered 2021-03-17: 15 mg via INTRAVENOUS
  Administered 2021-03-17: 5 mg via INTRAVENOUS

## 2021-03-17 MED ORDER — LACTATED RINGERS IV SOLN: INTRAVENOUS | Status: DC

## 2021-03-17 MED ORDER — ONDANSETRON HCL 4 MG/2ML IJ SOLN
INTRAMUSCULAR | Status: AC
  Filled 2021-03-17: qty 2

## 2021-03-17 MED ORDER — OXYCODONE HCL 5 MG/5ML PO SOLN
5.0000 mg | Freq: Once | ORAL | Status: AC | PRN
Start: 2021-03-17 — End: 2021-03-17

## 2021-03-17 MED ORDER — LIDOCAINE HCL (CARDIAC) PF 100 MG/5ML IV SOSY
PREFILLED_SYRINGE | INTRAVENOUS | Status: DC | PRN
  Administered 2021-03-17: 40 mg via INTRAVENOUS

## 2021-03-17 MED ORDER — OXYCODONE HCL 5 MG PO TABS
ORAL_TABLET | ORAL | Status: AC
  Filled 2021-03-17: qty 1

## 2021-03-17 MED ORDER — CELECOXIB 200 MG PO CAPS
400.0000 mg | ORAL_CAPSULE | ORAL | Status: AC
Start: 2021-03-17 — End: 2021-03-17
  Administered 2021-03-17: 400 mg via ORAL

## 2021-03-17 MED ORDER — DEXMEDETOMIDINE (PRECEDEX) IN NS 20 MCG/5ML (4 MCG/ML) IV SYRINGE
PREFILLED_SYRINGE | INTRAVENOUS | Status: DC | PRN
  Administered 2021-03-17: 8 ug via INTRAVENOUS

## 2021-03-17 MED ORDER — LIDOCAINE HCL (PF) 2 % IJ SOLN
INTRAMUSCULAR | Status: AC
  Filled 2021-03-17: qty 5

## 2021-03-17 MED ORDER — AMISULPRIDE (ANTIEMETIC) 5 MG/2ML IV SOLN: 10.0000 mg | Freq: Once | INTRAVENOUS | Status: DC | PRN

## 2021-03-17 MED ORDER — MIDAZOLAM HCL 2 MG/2ML IJ SOLN
INTRAMUSCULAR | Status: AC
  Filled 2021-03-17: qty 2

## 2021-03-17 MED ORDER — CLINDAMYCIN PHOSPHATE 600 MG/50ML IV SOLN
INTRAVENOUS | Status: AC
  Filled 2021-03-17: qty 50

## 2021-03-17 MED ORDER — ENSURE PRE-SURGERY PO LIQD: 296.0000 mL | Freq: Once | ORAL | Status: DC

## 2021-03-17 MED ORDER — IBUPROFEN 800 MG PO TABS: 800.0000 mg | ORAL_TABLET | Freq: Three times a day (TID) | ORAL | 0 refills | Status: AC | PRN

## 2021-03-17 MED ORDER — OXYCODONE HCL 5 MG PO TABS
5.0000 mg | ORAL_TABLET | Freq: Once | ORAL | Status: AC | PRN
  Administered 2021-03-17: 5 mg via ORAL

## 2021-03-17 MED ORDER — ACETAMINOPHEN 500 MG PO TABS
1000.0000 mg | ORAL_TABLET | ORAL | Status: AC
  Administered 2021-03-17: 1000 mg via ORAL

## 2021-03-17 MED ORDER — FENTANYL CITRATE (PF) 100 MCG/2ML IJ SOLN: 25.0000 ug | INTRAMUSCULAR | Status: DC | PRN

## 2021-03-17 MED ORDER — FENTANYL CITRATE (PF) 100 MCG/2ML IJ SOLN
INTRAMUSCULAR | Status: AC
  Filled 2021-03-17: qty 2

## 2021-03-17 SURGICAL SUPPLY — 37 items
ADH SKN CLS APL DERMABOND .7 (GAUZE/BANDAGES/DRESSINGS) ×1
APL PRP STRL LF DISP 70% ISPRP (MISCELLANEOUS) ×1
BLADE SURG 15 STRL LF DISP TIS (BLADE) ×1 IMPLANT
BLADE SURG 15 STRL SS (BLADE) ×2
CHLORAPREP W/TINT 26 (MISCELLANEOUS) ×2 IMPLANT
COVER BACK TABLE 60X90IN (DRAPES) ×2 IMPLANT
COVER MAYO STAND STRL (DRAPES) ×2 IMPLANT
DERMABOND ADVANCED (GAUZE/BANDAGES/DRESSINGS) ×1
DERMABOND ADVANCED .7 DNX12 (GAUZE/BANDAGES/DRESSINGS) ×1 IMPLANT
DRAPE LAPAROTOMY 100X72 PEDS (DRAPES) ×2 IMPLANT
DRAPE SHEET LG 3/4 BI-LAMINATE (DRAPES) ×2 IMPLANT
DRAPE UTILITY XL STRL (DRAPES) ×2 IMPLANT
ELECT REM PT RETURN 9FT ADLT (ELECTROSURGICAL) ×2
ELECTRODE REM PT RTRN 9FT ADLT (ELECTROSURGICAL) ×1 IMPLANT
GAUZE 4X4 16PLY ~~LOC~~+RFID DBL (SPONGE) ×2 IMPLANT
GLOVE SURG ENC MOIS LTX SZ6.5 (GLOVE) ×2 IMPLANT
GLOVE SURG POLYISO LF SZ7 (GLOVE) ×2 IMPLANT
GLOVE SURG UNDER POLY LF SZ6.5 (GLOVE) ×2 IMPLANT
GLOVE SURG UNDER POLY LF SZ7 (GLOVE) ×6 IMPLANT
GOWN STRL REUS W/TWL LRG LVL3 (GOWN DISPOSABLE) ×6 IMPLANT
KIT TURNOVER CYSTO (KITS) ×2 IMPLANT
MANIFOLD NEPTUNE II (INSTRUMENTS) ×2 IMPLANT
NEEDLE HYPO 25X1 1.5 SAFETY (NEEDLE) ×2 IMPLANT
NS IRRIG 500ML POUR BTL (IV SOLUTION) ×2 IMPLANT
PACK BASIN DAY SURGERY FS (CUSTOM PROCEDURE TRAY) ×2 IMPLANT
PENCIL SMOKE EVACUATOR (MISCELLANEOUS) ×2 IMPLANT
SUT ETHILON 3 0 FSL (SUTURE) ×2 IMPLANT
SUT MNCRL AB 4-0 PS2 18 (SUTURE) ×2 IMPLANT
SUT SILK 3 0 (SUTURE) ×2
SUT SILK 3-0 18XBRD TIE 12 (SUTURE) ×1 IMPLANT
SUT VIC AB 3-0 SH 27 (SUTURE) ×2
SUT VIC AB 3-0 SH 27X BRD (SUTURE) ×1 IMPLANT
SYR BULB IRRIG 60ML STRL (SYRINGE) ×2 IMPLANT
SYR CONTROL 10ML LL (SYRINGE) ×2 IMPLANT
TOWEL OR 17X26 10 PK STRL BLUE (TOWEL DISPOSABLE) ×2 IMPLANT
TUBE CONNECTING 12X1/4 (SUCTIONS) ×2 IMPLANT
YANKAUER SUCT BULB TIP NO VENT (SUCTIONS) ×2 IMPLANT

## 2021-03-17 NOTE — Op Note (Signed)
Preoperative diagnosis: Right shoulder lumpectomy  Postoperative diagnosis: same   Procedure: Lipoma removal  Surgeon: Gurney Maxin, M.D.  Asst: Zacarias Pontes, M.D.  Anesthesia: General with LMA  Indications for procedure: Alice Ray is a 55 y.o. year old female with symptoms of right shoulder mass.  Description of procedure: The patient was brought into the operative suite. Anesthesia was administered with General LMA anesthesia. WHO checklist was applied. The patient was then placed in supine position. The area was prepped and draped in the usual sterile fashion.  Next, marcaine was infused over the area. A 4cm incision was made through the skin. Cautery was used to dissect through the subcutaneous tissue. Blunt dissection and cautery was used to dissect the mass free of surrounding attachments.  The mass was both in the subcutaneous tissue and intra-muscular.The mass was removed in its entirety. The wound was irrigated. The incision was closed with 3-0 vicryl in interrupted fashion and the skin was closed with 4-0 monocryl in running subcuticular stitch. Dermabond was placed over the incision. The patient awoke from anesthesia and brought to pacu in stable condition. All counts were correct. The patient tolerated the procedure well.  Findings: 3cm subcutaneous and intra-muscular mass  Specimen: 3cm subcutaneous and intra-muscular mass  Implant: None   Blood loss: 3cc  Local anesthesia:  15 ml marcaine   Complications: No immediate complications  Zacarias Pontes, MD

## 2021-03-17 NOTE — Anesthesia Procedure Notes (Addendum)
Procedure Name: LMA Insertion Date/Time: 03/17/2021 9:21 AM Performed by: Rogers Blocker, CRNA Pre-anesthesia Checklist: Patient identified, Emergency Drugs available, Suction available and Patient being monitored Patient Re-evaluated:Patient Re-evaluated prior to induction Oxygen Delivery Method: Circle system utilized Preoxygenation: Pre-oxygenation with 100% oxygen Induction Type: IV induction Ventilation: Mask ventilation without difficulty LMA: LMA inserted LMA Size: 4.0 Number of attempts: 1 Placement Confirmation: positive ETCO2 Tube secured with: Tape Dental Injury: Teeth and Oropharynx as per pre-operative assessment

## 2021-03-17 NOTE — Anesthesia Preprocedure Evaluation (Signed)
Anesthesia Evaluation  Patient identified by MRN, date of birth, ID band Patient awake    Reviewed: Allergy & Precautions, NPO status , Patient's Chart, lab work & pertinent test results  Airway Mallampati: II  TM Distance: >3 FB Neck ROM: Full    Dental  (+) Teeth Intact, Dental Advisory Given   Pulmonary asthma , former smoker,    breath sounds clear to auscultation       Cardiovascular hypertension, Pt. on medications and Pt. on home beta blockers + CAD   Rhythm:Regular Rate:Normal     Neuro/Psych  Headaches, Anxiety    GI/Hepatic Neg liver ROS, GERD  Medicated,  Endo/Other  negative endocrine ROS  Renal/GU negative Renal ROS     Musculoskeletal  (+) Arthritis ,   Abdominal Normal abdominal exam  (+)   Peds  Hematology negative hematology ROS (+)   Anesthesia Other Findings   Reproductive/Obstetrics                             Anesthesia Physical Anesthesia Plan  ASA: 2  Anesthesia Plan: General   Post-op Pain Management:    Induction: Intravenous  PONV Risk Score and Plan: 4 or greater and Ondansetron, Dexamethasone, Midazolam and Scopolamine patch - Pre-op  Airway Management Planned: LMA  Additional Equipment: None  Intra-op Plan:   Post-operative Plan: Extubation in OR  Informed Consent: I have reviewed the patients History and Physical, chart, labs and discussed the procedure including the risks, benefits and alternatives for the proposed anesthesia with the patient or authorized representative who has indicated his/her understanding and acceptance.     Dental advisory given  Plan Discussed with: CRNA  Anesthesia Plan Comments:         Anesthesia Quick Evaluation

## 2021-03-17 NOTE — Transfer of Care (Signed)
Immediate Anesthesia Transfer of Care Note  Patient: Alice Ray  Procedure(s) Performed: EXCISION of right shoulder MASS (Right: Shoulder)  Patient Location: PACU  Anesthesia Type:General  Level of Consciousness: awake, alert  and oriented  Airway & Oxygen Therapy: Patient Spontanous Breathing and Patient connected to nasal cannula oxygen  Post-op Assessment: Report given to RN and Post -op Vital signs reviewed and stable  Post vital signs: Reviewed and stable  Last Vitals:  Vitals Value Taken Time  BP 145/88 03/17/21 1019  Temp    Pulse 87 03/17/21 1022  Resp 19 03/17/21 1022  SpO2 95 % 03/17/21 1022  Vitals shown include unvalidated device data.  Last Pain:  Vitals:   03/17/21 0802  TempSrc: Oral  PainSc: 1       Patients Stated Pain Goal: 4 ([id number])  Complications: No notable events documented.

## 2021-03-17 NOTE — H&P (Signed)
Alice Ray is an 55 y.o. female.  HPI: She has a right sided chest wall mass that has enlarged this year. It has some sensitivity and soreness to the area.  Past Medical History:  Diagnosis Date   Anxiety    Arthritis    Chest wall mass    Chronic headaches    intermittant bilateral occipital headaches   Chronic interstitial cystitis    per pt last flare-up approx 02/ 2022   Chronic pain syndrome    followed by pain clinic;   knees, hips, shoulder left,  neck , lower back   Coronary artery disease    cardiologist--- dr w. 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 approxl 2012 in [place],  pt stated told unsure cause   Hyperlipidemia, mixed    Hypertension    Mild intermittent asthma    followed by pcp   S/P insertion of spinal cord stimulator    original placement for lumbar in 2001 and cervical 2004;  both generator's last change in 2019 to [facility] Jude dual lumbar and cervical Nevro, pt has a control    Past Surgical History:  Procedure Laterality Date   ANTERIOR FUSION CERVICAL SPINE     2001  C4--5;   2002  C5--6   BREAST LUMPECTOMY Right 1997   CARPAL TUNNEL RELEASE Right 2001   COLONOSCOPY     last one 2021   CYSTOSCOPY WITH HYDRODISTENSION AND BIOPSY     x3  1990s   ESOPHAGOGASTRODUODENOSCOPY     last one 2012 approx   [place]   per pt Rectus Hernia split repair   HIP SURGERY  1998   per pt total reconstruction [place] to [place]    L5--S1  per pt with cage   [place]  2019   per pt both lumbar and cervical generator change has happened twice priot 2019 then changed  to Nevro  [facility] in [place])   SPINAL CORD STIMULATOR INSERTION     2001 lumbar;   2004 cervical   VAGINAL HYSTERECTOMY  1989   VOCAL CORD INJECTION Left    x2  appprox 2012 for left partial paresis  (in [place])    Family History  Problem Relation Age of Onset   Colitis Father    Heart disease Father    Diverticulosis Sister    Colitis Paternal Aunt     Social History:  reports that she quit smoking about 7 years ago. Her smoking use included cigarettes. She has never used smokeless tobacco. She reports that she does not drink alcohol and does not use drugs.  Allergies:  Allergies  Allergen Reactions   Penicillins Anaphylaxis    Did it involve swelling of the face/tongue/throat, SOB, or low BP? Yes Did it involve sudden or severe rash/hives, skin peeling, or any reaction on the inside of  your mouth or nose? Yes Did you need to seek medical attention at a hospital or doctor's office? Unknown When did it last happen? Childhood (~ 55YO) If all above answers are "NO", may proceed with cephalosporin use.     Medications: I have reviewed the patient's current medications.  Results for orders placed or performed during the hospital encounter of 03/17/21 (from the past 48 hour(s))  I-STAT, chem 8     Status: Abnormal   Collection Time: 03/17/21  8:10 AM  Result Value Ref Range   Sodium 139 135 - 145 mmol/L   Potassium 4.2 3.5 - 5.1 mmol/L   Chloride 104 98 - 111 mmol/L   BUN 19 6 - 20 mg/dL   Creatinine, Ser 0.70 0.44 - 1.00 mg/dL   Glucose, Bld 106 (H) 70 - 99 mg/dL    Comment: Glucose reference range applies only to samples taken after fasting for at least 8 hours.   Calcium, Ion 1.26 1.15 - 1.40 mmol/L   TCO2 25 22 - 32 mmol/L   Hemoglobin 15.0 12.0 - 15.0 g/dL   HCT 44.0 36.0 - 46.0  %    No results found.  Review of Systems  Constitutional:  Negative for chills and fever.  HENT:  Negative for hearing loss.   Eyes:  Negative for blurred vision and double vision.  Respiratory:  Negative for cough and hemoptysis.   Cardiovascular:  Negative for chest pain and palpitations.  Gastrointestinal:  Negative for abdominal pain, nausea and vomiting.  Genitourinary:  Negative for dysuria and urgency.  Musculoskeletal:  Negative for myalgias and neck pain.  Skin:  Negative for itching and rash.  Neurological:  Negative for dizziness, tingling and headaches.  Endo/Heme/Allergies:  Does not bruise/bleed easily.  Psychiatric/Behavioral:  Negative for depression and suicidal ideas.    PE Blood pressure (!) 142/85, pulse 81, temperature 97.6 F (36.4 C), temperature source Oral, resp. rate 15, height 5' 8.5" (1.74 m), weight 65 kg, SpO2 99 %. Constitutional: NAD; conversant; no deformities Eyes: Moist conjunctiva; no lid lag; anicteric; PERRL Neck: Trachea midline; no thyromegaly Lungs: Normal respiratory effort; no tactile fremitus CV: RRR; no palpable thrills; no pitting edema GI: Abd nontenter; no palpable hepatosplenomegaly MSK: Normal gait; no clubbing/cyanosis Psychiatric: Appropriate affect; alert and oriented x3 Lymphatic: No palpable cervical or axillary lymphadenopathy Skin: R mobile mass anterior to coracoid processWarm and dry   Assessment/Plan: 55 yo female with subcutaneous chest wall mass -excision of mass -outpatient procedure  Alice Ray Alice Ray 03/17/2021, 8:52 AM

## 2021-03-17 NOTE — Anesthesia Postprocedure Evaluation (Signed)
Anesthesia Post Note  Patient: Alice Ray  Procedure(s) Performed: EXCISION of right shoulder MASS (Right: Shoulder)     Patient location during evaluation: PACU Anesthesia Type: General Level of consciousness: awake and alert Pain management: pain level controlled Vital Signs Assessment: post-procedure vital signs reviewed and stable Respiratory status: spontaneous breathing, nonlabored ventilation, respiratory function stable and patient connected to nasal cannula oxygen Cardiovascular status: blood pressure returned to baseline and stable Postop Assessment: no apparent nausea or vomiting Anesthetic complications: no   No notable events documented.  Last Vitals:  Vitals:   03/17/21 1045 03/17/21 1047  BP:  134/83  Pulse: 70 76  Resp: 20 20  Temp:    SpO2: 94% 95%    Last Pain:  Vitals:   03/17/21 1047  TempSrc:   PainSc: 0-No pain                 Effie Berkshire

## 2021-03-17 NOTE — Discharge Instructions (Signed)

## 2021-03-20 ENCOUNTER — Encounter (HOSPITAL_BASED_OUTPATIENT_CLINIC_OR_DEPARTMENT_OTHER): Payer: Self-pay | Admitting: General Surgery

## 2021-03-20 LAB — SURGICAL PATHOLOGY

## 2021-03-22 ENCOUNTER — Telehealth: Payer: Self-pay | Admitting: General Surgery

## 2021-03-27 ENCOUNTER — Encounter (HOSPITAL_BASED_OUTPATIENT_CLINIC_OR_DEPARTMENT_OTHER): Payer: Self-pay

## 2021-05-26 IMAGING — CT CT CTA ABD/PEL W/CM AND/OR W/O CM
3 of 12 series · 11 of 46 positions shown, 16 images · IV contrast (omnipaque)
Comparison: None.

CLINICAL DATA: 53-year-old with diarrhea at approximately 2 o'clock
a.m. this morning, with subsequent bright red blood per rectum since
that time. Personal history of salmonella colitis.

EXAM:
CTA ABDOMEN AND PELVIS WITHOUT AND WITH CONTRAST
TECHNIQUE: Initially, multidetector CT of the abdomen and pelvis was performed
prior to IV contrast administration. Multidetector CT imaging of the
abdomen and pelvis was then performed using the standard protocol
during bolus administration of intravenous contrast. Multiplanar
reconstructed images and MIPs were obtained and reviewed to evaluate
the vascular anatomy.
CONTRAST:  100mL OMNIPAQUE IOHEXOL 350 MG/ML IV.

[Series 6: axial arterial · axial · arterial · 0.67mm/px · z∈[-465,-203]mm · 6 of 225 slices shown]
[im 19/225  soft-tissue]
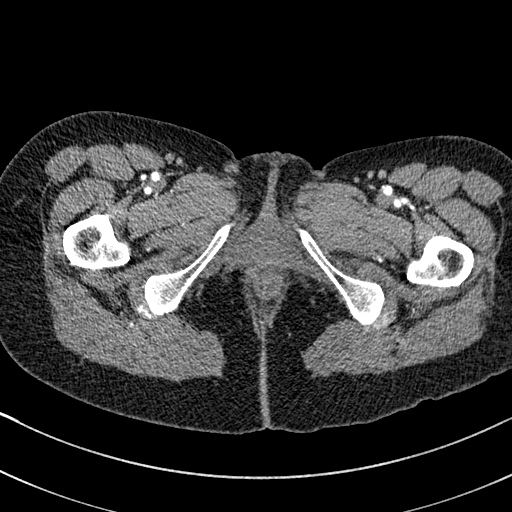
[im 57/225  soft-tissue]
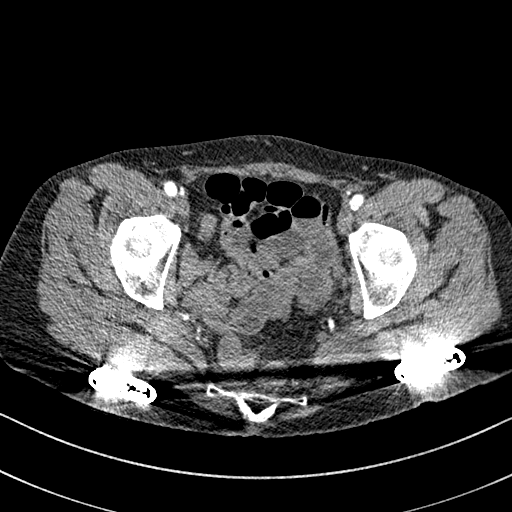
[im 75/225  soft-tissue]
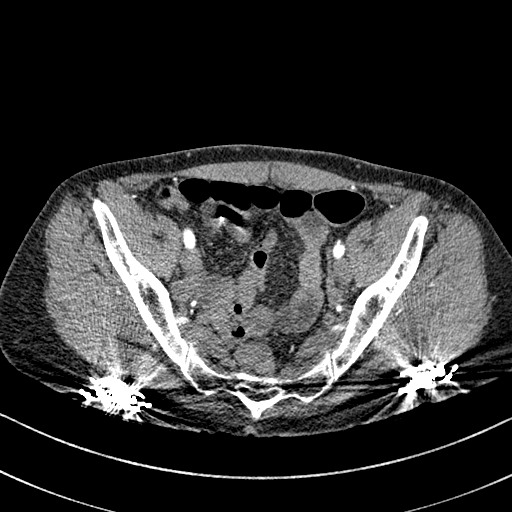
[im 94/225  soft-tissue]
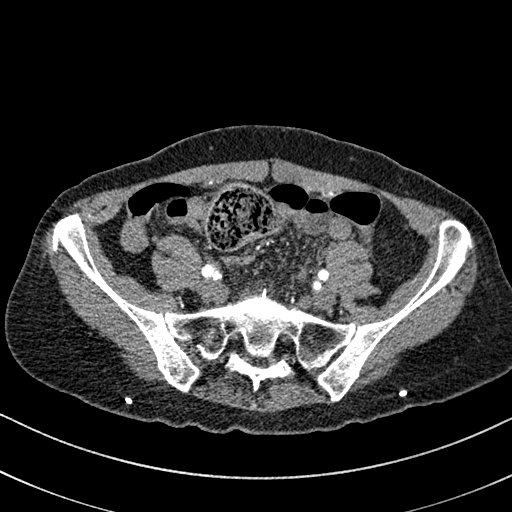
[im 131/225  soft-tissue]
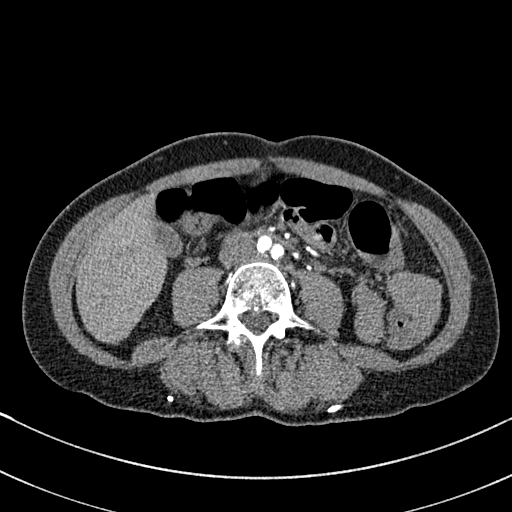
[im 150/225  soft-tissue]
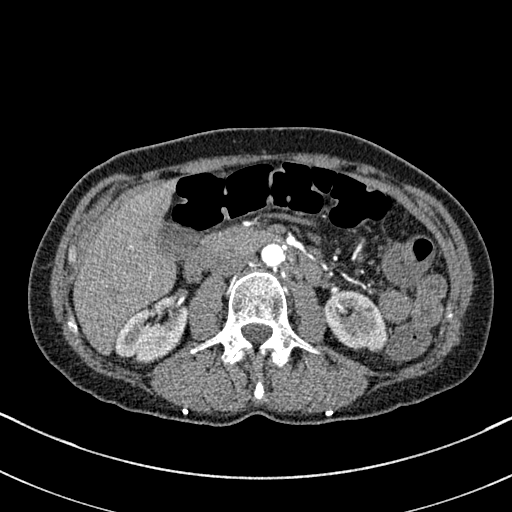

[Series 9: coronal arterial · coronal · arterial · 0.68mm/px · 2 of 112 slices shown, 3 images]
[im 38/112  soft-tissue]
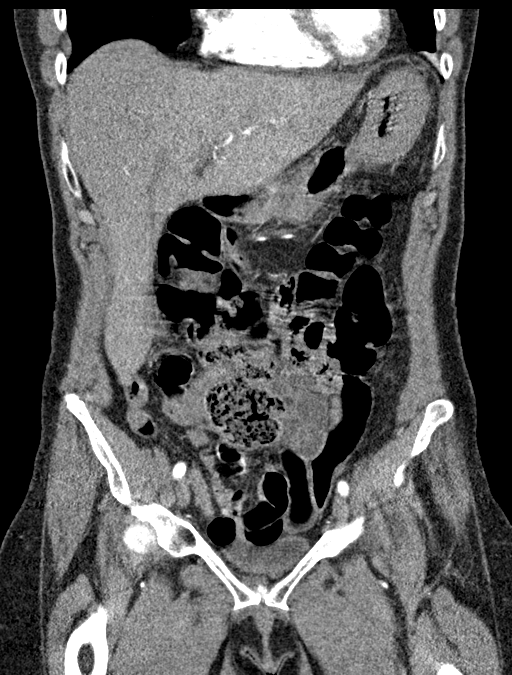
[im 38/112  bone]
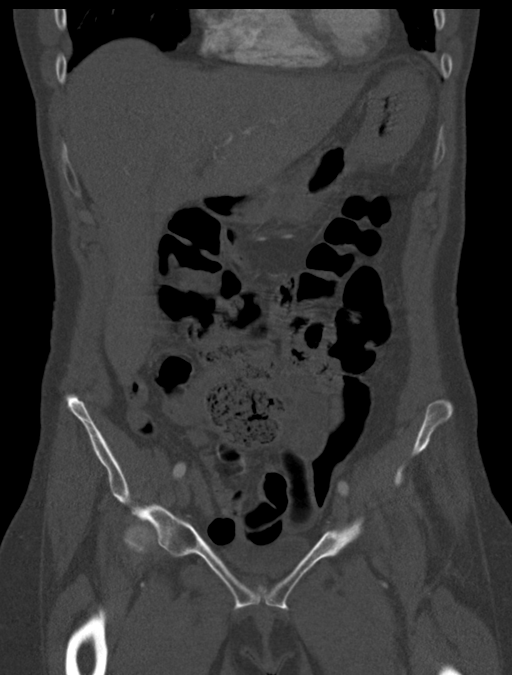
[im 75/112  soft-tissue]
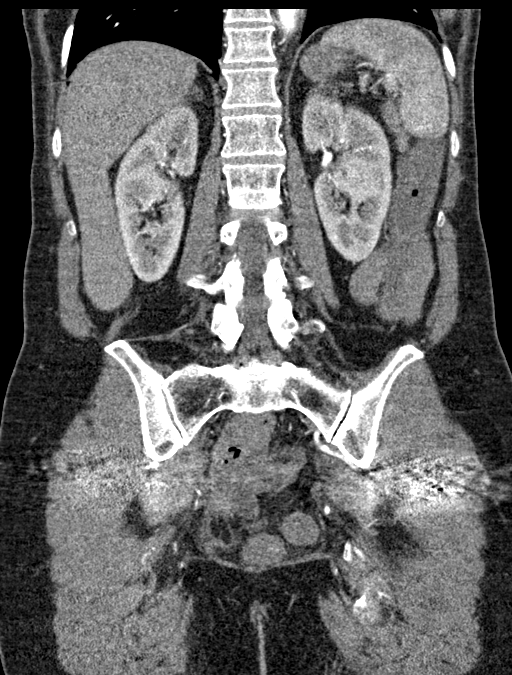

[Series 13: axial portal venous · axial · portal-venous · 0.69mm/px · z∈[-393,-168]mm · 3 of 91 slices shown, 7 images]
[im 23/91  soft-tissue]
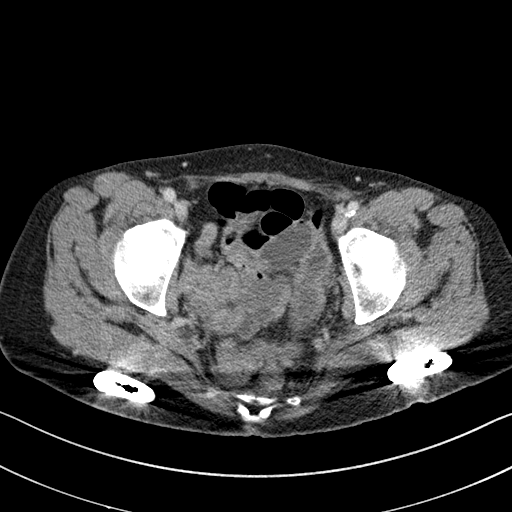
[im 23/91  lung]
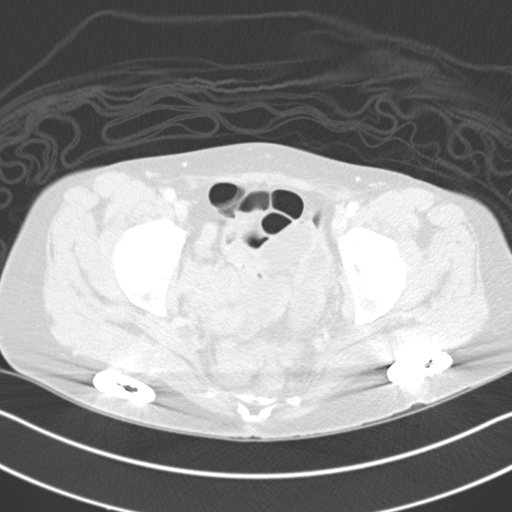
[im 23/91  bone]
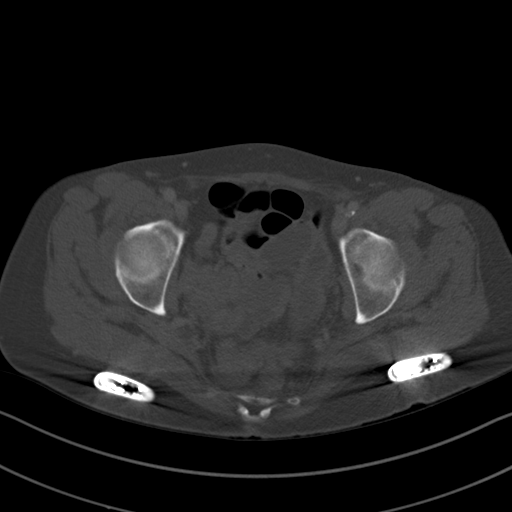
[im 46/91  soft-tissue]
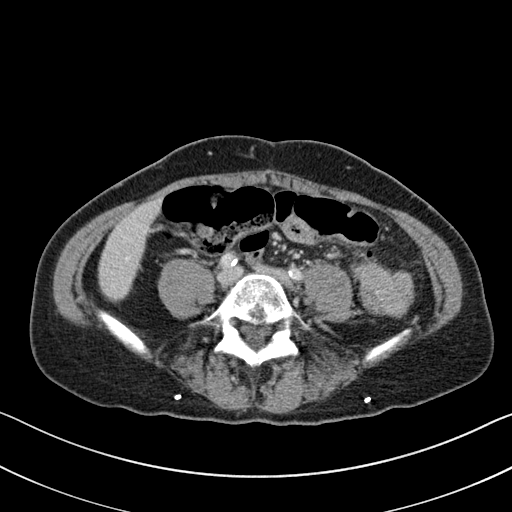
[im 46/91  lung]
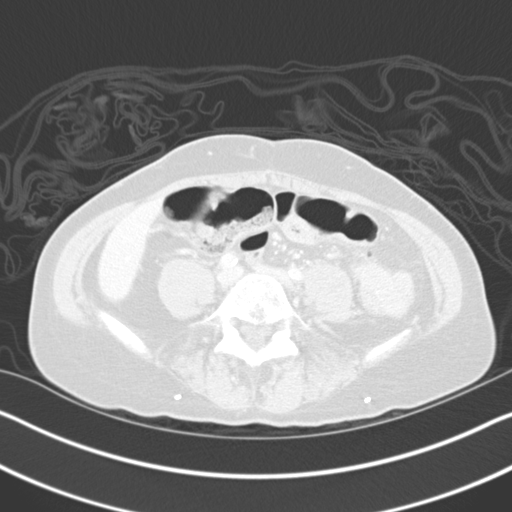
[im 68/91  soft-tissue]
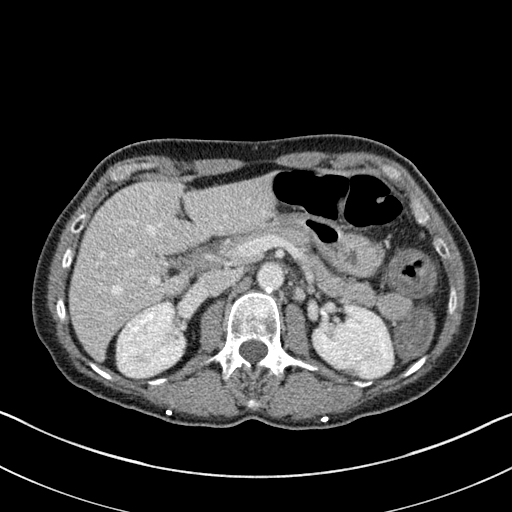
[im 68/91  lung]
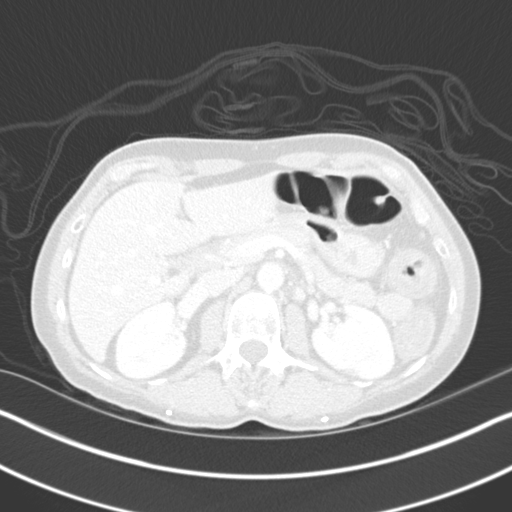

[11 of 46 positions shown; findings below may reference images not displayed]

FINDINGS: VASCULAR

Aorta: Mild to moderate atherosclerosis. No evidence of aortic
aneurysm.

Celiac: Widely patent. No visible atherosclerosis.

SMA: Widely patent. No visible atherosclerosis.

Renals: Single RIGHT renal artery, widely patent, with minimal
atherosclerosis at its origin. Two LEFT renal arteries, including an
accessory LOWER pole LEFT renal artery which arises from the
ANTERIOR aorta distally, both widely patent without significant
atherosclerosis.

IMA: Patent with atherosclerosis at its origin and possible origin
stenosis.

Inflow: Moderate atherosclerosis involving the common iliac arteries
and mild atherosclerosis involving the external iliac arteries
without evidence of significant stenosis.

Proximal Outflow: Mild atherosclerosis involving the superficial
femoral arteries bilaterally without evidence of significant
stenosis.

Veins: Normal-appearing portal venous and systemic venous systems.

Review of the MIP images confirms the above findings.

NON-VASCULAR

Lower chest: Visualized lung bases clear. Upper normal heart size.

Hepatobiliary: Prominent Piotr Stanislaw lobe. Borderline hepatomegaly. No
focal hepatic parenchymal abnormalities. Gallbladder normal in
appearance without calcified gallstones. No biliary ductal dilation.

Pancreas: Normal in appearance without evidence of mass, ductal
dilation, or inflammation.

Spleen: Normal in size and appearance.

Adrenals/Urinary Tract: Normal appearing adrenal glands. Kidneys
normal in size and appearance without focal parenchymal abnormality.
No hydronephrosis. No evidence of urinary tract calculi. Normal
appearing urinary bladder.

Stomach/Bowel: Stomach normal in appearance for the degree of
distention. Normal-appearing small bowel. Wall thickening/edema
involving the descending colon. Remainder of the colon normal in
appearance. Appendix not conspicuous, but no pericecal inflammation.

Lymphatic: No pathologic lymphadenopathy.

Reproductive: Uterus presumed surgically absent. No adnexal masses.

Other: Numerous pelvic phleboliths. Battery generator packs for what
I presume are dorsal cord stimulating devices are present in the
buttocks.

Musculoskeletal: Prior posterior decompression and ray cage fusion
at L5-S1. No acute findings.
IMPRESSION: VASCULAR

1. No evidence of abdominal aortic aneurysm or dissection.
2. Possible origin stenosis involving the inferior mesenteric
artery. The celiac and SMA are widely patent. The IMA is patent with
possible origin stenosis. Therefore, no evidence of large vessel
mesenteric ischemia.
3. Bilateral iliofemoral atherosclerosis without evidence of
significant stenosis.
4. Single RIGHT renal artery and 2 LEFT renal arteries which are
widely patent.

NON-VASCULAR

1. Wall thickening/edema involving the descending colon indicating
colitis.
2. No acute abnormalities otherwise involving the abdomen or pelvis.

## 2021-07-04 ENCOUNTER — Other Ambulatory Visit: Payer: Self-pay | Admitting: General Surgery

## 2021-07-04 DIAGNOSIS — D1721 Benign lipomatous neoplasm of skin and subcutaneous tissue of right arm: Secondary | ICD-10-CM

## 2021-07-07 ENCOUNTER — Ambulatory Visit
Admission: RE | Admit: 2021-07-07 | Discharge: 2021-07-07 | Disposition: A | Discharge status: Home / Self Care | Payer: BC Managed Care – PPO | Source: Ambulatory Visit | Attending: General Surgery | Admitting: General Surgery

## 2021-07-07 DIAGNOSIS — D1721 Benign lipomatous neoplasm of skin and subcutaneous tissue of right arm: Secondary | ICD-10-CM

## 2021-07-13 ENCOUNTER — Other Ambulatory Visit: Payer: BC Managed Care – PPO

## 2021-12-04 ENCOUNTER — Encounter (HOSPITAL_BASED_OUTPATIENT_CLINIC_OR_DEPARTMENT_OTHER): Payer: Self-pay | Admitting: Cardiovascular Disease

## 2021-12-04 NOTE — Telephone Encounter (Signed)
Spoke to patient she stated she has passed out twice.She has been having occasional chest pain.No pain at present.Advised Dr.O'Neal is out of office this week.Appointment scheduled with Laurann Montana NP Tue 5/2 at 2:25 pm at [facility] location.I will make Dr.O'Neal aware. ?

## 2021-12-05 ENCOUNTER — Ambulatory Visit (INDEPENDENT_AMBULATORY_CARE_PROVIDER_SITE_OTHER): Payer: 59 | Admitting: Family

## 2021-12-05 ENCOUNTER — Encounter (HOSPITAL_BASED_OUTPATIENT_CLINIC_OR_DEPARTMENT_OTHER): Payer: Self-pay

## 2021-12-05 ENCOUNTER — Encounter (HOSPITAL_BASED_OUTPATIENT_CLINIC_OR_DEPARTMENT_OTHER): Payer: Self-pay | Admitting: Family

## 2021-12-05 ENCOUNTER — Ambulatory Visit (INDEPENDENT_AMBULATORY_CARE_PROVIDER_SITE_OTHER): Payer: 59

## 2021-12-05 VITALS — BP 138/80 | HR 66 | Ht 68.5 in | Wt 143.2 lb

## 2021-12-05 DIAGNOSIS — I25118 Atherosclerotic heart disease of native coronary artery with other forms of angina pectoris: Secondary | ICD-10-CM

## 2021-12-05 DIAGNOSIS — E7801 Familial hypercholesterolemia: Secondary | ICD-10-CM

## 2021-12-05 DIAGNOSIS — R55 Syncope and collapse: Secondary | ICD-10-CM | POA: Diagnosis not present

## 2021-12-05 DIAGNOSIS — R002 Palpitations: Secondary | ICD-10-CM

## 2021-12-05 NOTE — Patient Instructions (Signed)
Medication Instructions:  ?Your Physician recommend you continue on your current medication as directed.   ? ?*If you need a refill on your cardiac medications before your next appointment, please call your pharmacy* ? ? ?Lab Work: ?None ordered today  ? ?Testing/Procedures: ?Your physician has requested that you have an echocardiogram. Echocardiography is a painless test that uses sound waves to create images of your heart. It provides your doctor with information about the size and shape of your heart and how well your heart?s chambers and valves are working. This procedure takes approximately one hour. There are no restrictions for this procedure. ?[place]  ? ?Your physician has recommended that you wear a Zio monitor.  ? ?This monitor is a medical device that records the heart?s electrical activity. Doctors most often use these monitors to diagnose arrhythmias. Arrhythmias are problems with the speed or rhythm of the heartbeat. The monitor is a small device applied to your chest. You can wear one while you do your normal daily activities. While wearing this monitor if you have any symptoms to push the button and record what you felt. Once you have worn this monitor for the period of time provider prescribed (Usually 14 days), you will return the monitor device in the postage paid box. Once it is returned they will download the data collected and provide [place] with a report which the provider will then review and we will call you with those results. Important tips: ? ?Avoid showering during the first 24 hours of wearing the monitor. ?Avoid excessive sweating to help maximize wear time. ?Do not submerge the device, no hot tubs, and no swimming pools. ?Keep any lotions or oils away from the patch. ?After 24 hours you may shower with the patch on. Take brief showers with your back facing the shower head.  ?Do not remove patch once it has been placed because that will interrupt data and  decrease adhesive wear time. ?Push the button when you have any symptoms and write down what you were feeling. ?Once you have completed wearing your monitor, remove and place into box which has postage paid and place in your outgoing mailbox.  ?If for some reason you have misplaced your box then call our office and we can provide another box and/or mail it off for you. ? ? ? ? ? ?Follow-Up: ?At [facility] you and your health needs are our priority.  As part of our continuing mission to provide you with exceptional heart care, we have created designated Provider Care Teams.  These Care Teams include your primary Cardiologist (physician) and Advanced Practice Providers (APPs -  Physician Assistants and Nurse Practitioners) who all work together to provide you with the care you need, when you need it. ? ?We recommend signing up for the patient portal called "MyChart".  Sign up information is provided on this After Visit Summary.  MyChart is used to connect with patients for Virtual Visits (Telemedicine).  Patients are able to view lab/test results, encounter notes, upcoming appointments, etc.  Non-urgent messages can be sent to your provider as well.   ?To learn more about what you can do with MyChart, go to [email or web address].   ? ?Your next appointment:   ?6 week(s) ? ?The format for your next appointment:   ?In Person ? ?Provider:   ?Evalina Field, MD  or APP{ ? ?Other Instructions ?Heart Healthy Diet Recommendations: ?A low-salt diet is recommended. Meats should be grilled, baked, or boiled. Avoid fried  foods. Focus on lean protein sources like fish or chicken with vegetables and fruits. The American Heart Association is a Microbiologist!  American Heart Association Diet and Lifeystyle Recommendations  ? ? ?Exercise recommendations: ?The American Heart Association recommends 150 minutes of moderate intensity exercise weekly. ?Try 30 minutes of moderate intensity exercise 4-5 times per week. ?This could  include walking, jogging, or swimming. ? ? ?Important Information About Sugar ? ? ? ? ? ? ?

## 2021-12-05 NOTE — Progress Notes (Signed)
? ?Office Visit  ?  ?Patient Name: Alice Ray ?Date of Encounter: 12/05/2021 ? ?PCP:  Joya Gaskins, FNP ?  ?Deputy  ?Cardiologist:  Evalina Field, MD  ?Advanced Practice Provider:  No care team member to display ?Electrophysiologist:  None  ?   ? ?Chief Complaint  ?  ?Alice Ray is a 56 y.o. female with a hx of spinal cord stimulator, prior tobacco use, athma, GERD, familial hyperlipidemia, CAD, HTN presents today for chest pain, syncope  ? ?Past Medical History  ?  ?Past Medical History:  ?Diagnosis Date  ? Anxiety   ? Arthritis   ? Chest wall mass   ? Chronic headaches   ? intermittant bilateral occipital headaches  ? Chronic interstitial cystitis   ? per pt last flare-up approx 02/ 2022  ? Chronic pain syndrome   ? followed by pain clinic;   knees, hips, shoulder left,  neck , lower back  ? Coronary artery disease   ? cardiologist--- dr w. Audie Box;  nonobstructive cad per [facility] 12-14-2019  ? GERD (gastroesophageal reflux disease)   ? Heterozygous familial hypercholesterolemia   ? History of 2019 novel coronavirus disease (COVID-19) 07/26/2020  ? positive result in epic, per pt moderate symptoms that resolved  ? History of gastric ulcer   ? per pt many yrs ago due to chronic nsaids  ? History of ischemic colitis   ? per pt told ischemic colitis 2021,  stated completed resolved  ? History of vocal cord paralysis   ? 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 Hyperlipidemia, mixed   ? Hypertension   ? Mild intermittent asthma   ? followed by pcp  ? S/P insertion of spinal cord stimulator   ? original placement for lumbar in 2001 and cervical 2004;  both generator's last change in 2019 to [facility] dual lumbar and cervical Nevro, pt has a control  ? ?Past Surgical History:  ?Procedure Laterality Date  ? ANTERIOR FUSION CERVICAL SPINE    ? 2001  C4--5;   2002  C5--6  ? BREAST LUMPECTOMY Right 1997  ? CARPAL  TUNNEL RELEASE Right 2001  ? COLONOSCOPY    ? last one 2021  ? CYSTOSCOPY WITH HYDRODISTENSION AND BIOPSY    ? x3  1990s  ? ESOPHAGOGASTRODUODENOSCOPY    ? last one 2012 approx  ? HERNIA REPAIR  1991  ? per pt Rectus Hernia split repair  ? [place]  ? per pt total reconstruction due to [place] from [place]  ? [place]  ? L5--S1  per pt with cage  ? MASS EXCISION Right 03/17/2021  ? Procedure: EXCISION of right shoulder MASS;  Surgeon: Kinsinger, Arta Bruce, MD;  Location: [facility];  Service: General;  Laterality: Right;  ? SEPTOPLASTY  1986  ? SPINAL CORD STIMULATOR BATTERY EXCHANGE  2019  ? per pt both lumbar and cervical generator change has happened twice priot 2019 then changed  to Nevro  [facility] in [place])  ? SPINAL CORD STIMULATOR INSERTION    ? 2001 lumbar;   2004 cervical  ? VAGINAL HYSTERECTOMY  1989  ? VOCAL CORD INJECTION Left   ? x2  appprox 2012 for left partial paresis  (in [place])  ? ? ?Allergies ? ?Allergies  ?Allergen Reactions  ? Penicillins Anaphylaxis  ?  Did it involve swelling  of the face/tongue/throat, SOB, or low BP? Yes ?Did it involve sudden or severe rash/hives, skin peeling, or any reaction on the inside of your mouth or nose? Yes ?Did you need to seek medical attention at a hospital or doctor's office? Unknown ?When did it last happen? Childhood (~ 56YO) ?If all above answers are "NO", may proceed with cephalosporin use. ?  ? ? ?History of Present Illness  ?  ?Alice Ray is a 56 y.o. female with a hx of spinal cord stimulator, prior tobacco use, athma, GERD, familial hyperlipidemia, CAD, HTN  last seen 01/30/21. ? ?Known familial hyperlipidemia with LDLR positive. Statin intolerant but able to tolerate Crestor '5mg'$  twice per week. She had cardiac CTA 12/2019 revealing coronary calcium score of 105 placing her in the 95th percentile with moderate plaque (25-49%) in the LAD.  ? ?She presents today for follow up after MyChart noting  syncopal episode. 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 She enjoys taking trips to hike.  ? ?Notes she was travelling for work standing in an outdoor arena when she felt chest pain like she had a toothpick was stuck in her chest. She felt near syncopal but did not pass out. Felt fine afterwards. Later event a few days later of chest pain that felt like sharp pain after she took a sip of water then lost consciousness for a few moments as witnessed by her son. Son described as she leaned back, put her head down, and noted she was making a weird noise. No incontinence during episode. 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 Last seizure > 15 years ago. Walks 5-8 miles per day without chest pain or dyspnea. Since seen last year has had 4 episode of palpitations with sensation of heart racing then pausing per her description. Checks BP intermittently at home. Usually "good" but cannot recall specifics. Heart rate usually 60-80bpm.  ? ?Orthostatic VS for the past 24 hrs (Last 3 readings): ? BP- Lying Pulse- Lying BP- Sitting Pulse- Sitting BP- Standing at 0 minutes Pulse- Standing at 0 minutes BP- Standing at 3 minutes Pulse- Standing at 3 minutes  ?12/05/21 1450 122/72 62 126/77 78 118/78 84 (!) 132/99 90  ? ? ? ?EKGs/Labs/Other Studies Reviewed:  ? ?The following studies were reviewed today: ? ?EKG:  EKG is  ordered today.  The ekg ordered today demonstrates NSR 66 bpm with no acute ST/T wave changes. ? ?Recent Labs: ?03/17/2021: BUN 19; Creatinine, Ser 0.70; Hemoglobin 15.0; Potassium 4.2; Sodium 139  ?Recent Lipid Panel ?   ?Component Value Date/Time  ? CHOL 201 (H) 01/19/2021 1006  ? TRIG 60 01/19/2021 1006  ? HDL 79 01/19/2021 1006  ? CHOLHDL 2.5 01/19/2021 1006  ? LDLCALC 111 (H) 01/19/2021 1006  ? ? ?Home Medications  ? ?Current Meds  ?Medication Sig  ? albuterol (VENTOLIN  HFA) 108 (90 Base) MCG/ACT inhaler Inhale 2 puffs into the lungs as needed for wheezing or shortness of breath.  ? aspirin EC 81 MG tablet Take 81 mg by mouth daily. Swallow whole.  ? B Complex Vitamins (VITAMIN B COMPLEX PO) Take by mouth daily.  ? Bacillus Coagulans-Inulin (PROBIOTIC-PREBIOTIC PO) Take by mouth daily.  ? Cholecalciferol (VITAMIN D3 PO) Take by mouth daily.  ? diclofenac Sodium (VOLTAREN) 1 % GEL Apply 1 application topically 4 (four) times daily as needed (joint pain).   ?  EPINEPHrine 0.3 mg/0.3 mL IJ SOAJ injection Inject 0.3 mg into the muscle Once PRN for anaphylaxis.  ? Evolocumab with Infusor ([place]) 420 MG/3.5ML SOCT Inject 420 mg into the skin every 30 (thirty) days.  ? ezetimibe (ZETIA) 10 MG tablet Take 1 tablet (10 mg total) by mouth daily. (Patient taking differently: Take 10 mg by mouth daily.)  ? ibuprofen (ADVIL) 800 MG tablet Take 1 tablet (800 mg total) by mouth every 8 (eight) hours as needed.  ? LORazepam (ATIVAN) 1 MG tablet Take 1 mg by mouth daily as needed for anxiety.   ? losartan (COZAAR) 100 MG tablet Take 50 mg by mouth daily.  ? metoprolol succinate (TOPROL-XL) 25 MG 24 hr tablet Take 25 mg by mouth every evening.  ? montelukast (SINGULAIR) 10 MG tablet Take 10 mg by mouth daily.  ? Omega-3 Fatty Acids (FISH OIL PO) Take by mouth daily.  ? pantoprazole (PROTONIX) 40 MG tablet Take 40 mg by mouth every morning.  ? rosuvastatin (CRESTOR) 5 MG tablet Take 5 mg by mouth 2 (two) times a week. Monday and Friday  ?  ? ?Review of Systems  ?    ?All other systems reviewed and are otherwise negative except as noted above. ? ?Physical Exam  ?  ?VS:  BP 138/80 (BP Location: Left Arm, Patient Position: Sitting, Cuff Size: Normal)   Pulse 66   Ht 5' 8.5" (1.74 m)   Wt 143 lb 3.2 oz (65 kg)   BMI 21.46 kg/m?  , BMI Body mass index is 21.46 kg/m?. ? ?Wt Readings from Last 3 Encounters:  ?12/05/21 143 lb 3.2 oz (65 kg)  ?03/17/21 143 lb 4.8 oz (65 kg)  ?01/30/21  148 lb (67.1 kg)  ?  ? ?GEN: Well nourished, well developed, in no acute distress. ?HEENT: normal. ?Neck: Supple, no JVD, carotid bruits, or masses. ?Cardiac: RRR, no murmurs, rubs, or gallops. No clubbing, cyano

## 2021-12-05 NOTE — Progress Notes (Unsigned)
Enrolled patient for a 14 day Zio XT monitor to be mailed to patients home   Dr O'Neal to read 

## 2021-12-06 ENCOUNTER — Ambulatory Visit (INDEPENDENT_AMBULATORY_CARE_PROVIDER_SITE_OTHER): Payer: 59

## 2021-12-06 DIAGNOSIS — R002 Palpitations: Secondary | ICD-10-CM | POA: Diagnosis not present

## 2021-12-06 LAB — ECHOCARDIOGRAM COMPLETE
AR max vel: 2.89 cm2
AV Area VTI: 2.73 cm2
AV Area mean vel: 2.55 cm2
AV Mean grad: 3 mmHg
AV Peak grad: 6.2 mmHg
Ao pk vel: 1.24 m/s
Area-P 1/2: 4.57 cm2
Calc EF: 73.6 %
S' Lateral: 2.26 cm
Single Plane A2C EF: 68.9 %
Single Plane A4C EF: 77.7 %

## 2021-12-07 LAB — BASIC METABOLIC PANEL
BUN/Creatinine Ratio: 27 — ABNORMAL HIGH (ref 9–23)
BUN: 21 mg/dL (ref 6–24)
CO2: 36 mmol/L — ABNORMAL HIGH (ref 20–29)
Calcium: 10.2 mg/dL (ref 8.7–10.2)
Chloride: 100 mmol/L (ref 96–106)
Creatinine, Ser: 0.78 mg/dL (ref 0.57–1.00)
Glucose: 85 mg/dL (ref 70–99)
Potassium: 4.3 mmol/L (ref 3.5–5.2)
Sodium: 141 mmol/L (ref 134–144)
eGFR: 89 mL/min/{1.73_m2} (ref >59)

## 2021-12-07 LAB — CBC WITH DIFFERENTIAL/PLATELET
Basophils Absolute: 0.1 10*3/uL (ref 0.0–0.2)
Basos: 1 %
EOS (ABSOLUTE): 0.1 10*3/uL (ref 0.0–0.4)
Eos: 2 %
Hematocrit: 43.3 % (ref 34.0–46.6)
Hemoglobin: 14.3 g/dL (ref 11.1–15.9)
Immature Grans (Abs): 0 10*3/uL (ref 0.0–0.1)
Immature Granulocytes: 0 %
Lymphocytes Absolute: 1.9 10*3/uL (ref 0.7–3.1)
Lymphs: 29 %
MCH: 29.9 pg (ref 26.6–33.0)
MCHC: 33 g/dL (ref 31.5–35.7)
MCV: 91 fL (ref 79–97)
Monocytes Absolute: 0.3 10*3/uL (ref 0.1–0.9)
Monocytes: 5 %
Neutrophils Absolute: 4.2 10*3/uL (ref 1.4–7.0)
Neutrophils: 63 %
Platelets: 297 10*3/uL (ref 150–450)
RBC: 4.78 x10E6/uL (ref 3.77–5.28)
RDW: 13.3 % (ref 11.7–15.4)
WBC: 6.6 10*3/uL (ref 3.4–10.8)

## 2021-12-07 LAB — TSH: TSH: 2.49 u[IU]/mL (ref 0.450–4.500)

## 2021-12-08 ENCOUNTER — Telehealth (HOSPITAL_BASED_OUTPATIENT_CLINIC_OR_DEPARTMENT_OTHER): Payer: Self-pay

## 2021-12-08 DIAGNOSIS — R55 Syncope and collapse: Secondary | ICD-10-CM | POA: Diagnosis not present

## 2021-12-08 NOTE — Telephone Encounter (Addendum)
Results called to patient who verbalizes understanding!  ? ? ? ? ?----- Message from Loel Dubonnet, NP sent at 12/07/2021  8:14 AM EDT ----- ?Normal kidney function, electrolytes. CBC with no evidence of anemia nor infection.  Normal thyroid function. Good result!  No significant abnormality that would cause syncope. ? ? ?Loel Dubonnet, NP  Gerald Stabs, RN ?Echocardiogram reveals normal heart pumping function.  Bilateral atria mildly dilated-ZIO monitor will help [place] determine if this is the result of any arrhythmia.  Small pericardial effusion not of concern as the body will self resolve.  Continue with plan for monitor as discussed in clinic.  ? ?

## 2021-12-19 ENCOUNTER — Encounter (HOSPITAL_BASED_OUTPATIENT_CLINIC_OR_DEPARTMENT_OTHER): Payer: Self-pay

## 2022-01-02 ENCOUNTER — Telehealth (HOSPITAL_BASED_OUTPATIENT_CLINIC_OR_DEPARTMENT_OTHER): Payer: Self-pay

## 2022-01-02 MED ORDER — METOPROLOL SUCCINATE ER 25 MG PO TB24: 37.5000 mg | ORAL_TABLET | Freq: Every evening | ORAL | 3 refills | Status: DC

## 2022-01-02 NOTE — Telephone Encounter (Addendum)
Seen by patient Alice Ray on 01/02/2022  9:35 AM  RN contacted patient to ensure she does not need refill due to dose change, she does not at this time, chart updated.    ----- Message from Loel Dubonnet, NP sent at 01/02/2022  7:33 AM EDT ----- Monitor with predominantly normal sinus rhythm. There were 7 episodes of a fast heart beat in the heart called SVT lasting as long as 5 seconds. Majority of triggered episodes coincide with sinus rhythm or sinus tachycardia. She did note the episodes of SVT.  Ensure staying well hydrated, avoiding caffeine. Recommend increase Toprol to 1.5 tablets daily (37.'5mg'$ ) and follow up as scheduled.

## 2022-01-15 ENCOUNTER — Encounter: Payer: Self-pay | Admitting: Cardiovascular Disease

## 2022-01-15 MED ORDER — METOPROLOL SUCCINATE ER 25 MG PO TB24: 37.5000 mg | ORAL_TABLET | Freq: Every evening | ORAL | 3 refills | Status: DC

## 2022-01-28 NOTE — Progress Notes (Signed)
Cardiology Office Note:   Date:  01/29/2022  NAME:  Alice Ray    MRN: [id number] DOB:  September 08, 1965   PCP:  Trisha Mangle, FNP  Cardiologist:  Reatha Harps, MD  Electrophysiologist:  None   Referring MD: Trisha Mangle, *   Chief Complaint  Patient presents with   Follow-up        History of Present Illness:   Alice Ray is a 56 y.o. female with a hx of familial hyperlipidemia, elevated coronary calcium score, hypertension presents for follow-up.  Reported symptoms of palpitations.  Monitor with brief SVT. She had a syncopal episode in late April.  Reports that she was outside working on her property.  Apparently felt a sharp pain in her chest and passed out.  She reports she was out for seconds maybe.  She reports she had plenty of water to drink that day.  She has had a lot of back pain recently but seems to be doing okay.  She did not urinate or defecate on herself.  She was by herself.  She came to quickly.  She completed a monitor which showed brief SVT.  She continues to have episodes of brief twinges in her chest.  Described as sharp discomfort.  Associated with SVT.  Symptoms have improved with metoprolol.  She is continuing to exercise.  Recently climbed [place].  It was an 11-hour hike.  She did this without limitations.  She is walking 5 miles per day.  Her blood pressure is quite normal.  We discussed backing off on losartan as I believe this could have contributed.  Given her high level of activity she may be on too much blood pressure medication.  She denies any chest pain or pressure when she walks.  Again can walk up to 5 miles.  To me this is noncardiac.  Needs her lipids rechecked.  Exam normal today.  Needs a refill on Zetia.  Problem List 1. Heterozygous familial hypercholesterolemia -LDLR positive  -statin intolerant -on repatha -T chol 201, HDL 79, LDL 111, TG 60 2. CAD -CAC score 105 (96th percentile) -mild (25-49%) LAD  plaque 3. HTN  Past Medical History: Past Medical History:  Diagnosis Date   Anxiety    Arthritis    Chest wall mass    Chronic headaches    intermittant bilateral occipital headaches   Chronic interstitial cystitis    per pt last flare-up approx 02/ 2022   Chronic pain syndrome    followed by pain clinic;   knees, hips, shoulder left,  neck , lower back   Coronary artery disease    cardiologist--- dr w. Flora Lipps;  nonobstructive cad per CCTA 12-14-2019   GERD (gastroesophageal reflux disease)    Heterozygous familial hypercholesterolemia    History of 2019 novel coronavirus disease (COVID-19) 07/26/2020   positive result in epic, per pt moderate symptoms that resolved   History of gastric ulcer    per pt many yrs ago due to chronic nsaids   History of ischemic colitis    per pt told ischemic colitis 2021,  stated completed resolved   History of vocal cord paralysis    left partial vocal cord paresis s/p injection x2 approxl 2012 in [place],  pt stated told unsure cause   Hyperlipidemia, mixed    Hypertension    Mild intermittent asthma    followed by pcp   S/P insertion of spinal cord stimulator    original placement for lumbar in 2001 and  cervical 2004;  both generator's last change in 2019 to [facility] dual lumbar and cervical Nevro, pt has a control    Past Surgical History: Past Surgical History:  Procedure Laterality Date   ANTERIOR FUSION CERVICAL SPINE     2001  C4--5;   2002  C5--6   BREAST LUMPECTOMY Right 1997   CARPAL TUNNEL RELEASE Right 2001   COLONOSCOPY     last one 2021   CYSTOSCOPY WITH HYDRODISTENSION AND BIOPSY     x3  1990s   ESOPHAGOGASTRODUODENOSCOPY     last one 2012 approx   HERNIA REPAIR  1991   per pt Rectus Hernia split repair   HIP SURGERY  1998   per pt [place]   LUMBAR FUSION  1998   L5--S1  per pt with cage   MASS EXCISION Right 03/17/2021   Procedure: EXCISION of right shoulder MASS;   Surgeon: Kinsinger, De Blanch, MD;  Location: [facility];  Service: General;  Laterality: Right;   SEPTOPLASTY  1986   SPINAL CORD STIMULATOR BATTERY EXCHANGE  2019   per pt both lumbar and cervical generator change has happened twice priot 2019 then changed  to Nevro  [facility] in [place])   SPINAL CORD STIMULATOR INSERTION     2001 lumbar;   2004 cervical   VAGINAL HYSTERECTOMY  1989   VOCAL CORD INJECTION Left    x2  appprox 2012 for left partial paresis  (in [place])    Current Medications: Current Meds  Medication Sig   albuterol (VENTOLIN HFA) 108 (90 Base) MCG/ACT inhaler Inhale 2 puffs into the lungs as needed for wheezing or shortness of breath.   aspirin EC 81 MG tablet Take 81 mg by mouth daily. Swallow whole.   B Complex Vitamins (VITAMIN B COMPLEX PO) Take by mouth daily.   Bacillus Coagulans-Inulin (PROBIOTIC-PREBIOTIC PO) Take by mouth daily.   Cholecalciferol (VITAMIN D3 PO) Take by mouth daily.   diclofenac Sodium (VOLTAREN) 1 % GEL Apply 1 application topically 4 (four) times daily as needed (joint pain).    EPINEPHrine 0.3 mg/0.3 mL IJ SOAJ injection Inject 0.3 mg into the muscle Once PRN for anaphylaxis.   Evolocumab with Infusor (REPATHA PUSHTRONEX SYSTEM) 420 MG/3.5ML SOCT Inject 420 mg into the skin every 30 (thirty) days.   ibuprofen (ADVIL) 800 MG tablet Take 1 tablet (800 mg total) by mouth every 8 (eight) hours as needed.   LORazepam (ATIVAN) 1 MG tablet Take 1 mg by mouth daily as needed for anxiety.    metoprolol succinate (TOPROL-XL) 25 MG 24 hr tablet Take 1.5 tablets (37.5 mg total) by mouth every evening.   montelukast (SINGULAIR) 10 MG tablet Take 10 mg by mouth daily.   Omega-3 Fatty Acids (FISH OIL PO) Take by mouth daily.   pantoprazole (PROTONIX) 40 MG tablet Take 40 mg by mouth every morning.   rosuvastatin (CRESTOR) 5 MG tablet Take 5 mg by mouth 2 (two) times a week. Monday and Friday   [DISCONTINUED] ezetimibe (ZETIA) 10 MG tablet  Take 1 tablet (10 mg total) by mouth daily. (Patient taking differently: Take 10 mg by mouth daily.)   [DISCONTINUED] losartan (COZAAR) 100 MG tablet Take 50 mg by mouth daily.     Allergies:    Penicillins   Social History: Social History   Socioeconomic History   Marital status: Married    Spouse name: Not on file   Number of children:  3   Years of education: Not on file   Highest education level: Not on file  Occupational History   Not on file  Tobacco Use   Smoking status: Former    Years: 25.00    Types: Cigarettes    Quit date: 2015    Years since quitting: 8.4   Smokeless tobacco: Never  Vaping Use   Vaping Use: Never used  Substance and Sexual Activity   Alcohol use: Never   Drug use: Never   Sexual activity: Not on file  Other Topics Concern   Not on file  Social History Narrative   Not on file   Social Determinants of Health   Financial Resource Strain: Not on file  Food Insecurity: Not on file  Transportation Needs: Not on file  Physical Activity: Not on file  Stress: Not on file  Social Connections: Not on file     Family History: The patient's family history includes Colitis in her father and paternal aunt; Diverticulosis in her sister; Heart disease in her father.  ROS:   All other ROS reviewed and negative. Pertinent positives noted in the HPI.     EKGs/Labs/Other Studies Reviewed:   The following studies were personally reviewed by me today:  Zio 12/05/2021 Impression: 1. Brief supraventricular tachycardia (7 episodes in 10 days, longest episode 5.1 seconds). Symptoms reported with this.  2. Rare ectopy.   CCTA 12/14/2019 IMPRESSION: 1. Coronary calcium score of 105. This was 96th percentile for age and sex matched controls.   2. Normal coronary origin with right dominance.   3. Mild, non-calcified plaque in the mid LAD (25-49%).   4. Diffuse minimal plaque (<25%) in the RCA/LCX (<25%).   5. Small PFO.  Recent Labs: 12/06/2021: BUN  21; Creatinine, Ser 0.78; Hemoglobin 14.3; Platelets 297; Potassium 4.3; Sodium 141; TSH 2.490   Recent Lipid Panel    Component Value Date/Time   CHOL 201 (H) 01/19/2021 1006   TRIG 60 01/19/2021 1006   HDL 79 01/19/2021 1006   CHOLHDL 2.5 01/19/2021 1006   LDLCALC 111 (H) 01/19/2021 1006    Physical Exam:   VS:  BP 118/82   Pulse 83   Ht 5' 8.5" (1.74 m)   Wt 142 lb (64.4 kg)   SpO2 95%   BMI 21.28 kg/m    Wt Readings from Last 3 Encounters:  01/29/22 142 lb (64.4 kg)  12/05/21 143 lb 3.2 oz (65 kg)  03/17/21 143 lb 4.8 oz (65 kg)    General: Well nourished, well developed, in no acute distress Head: Atraumatic, normal size  Eyes: PEERLA, EOMI  Neck: Supple, no JVD Endocrine: No thryomegaly Cardiac: Normal S1, S2; RRR; no murmurs, rubs, or gallops Lungs: Clear to auscultation bilaterally, no wheezing, rhonchi or rales  Abd: Soft, nontender, no hepatomegaly  Ext: No edema, pulses 2+ Musculoskeletal: No deformities, BUE and BLE strength normal and equal Skin: Warm and dry, no rashes   Neuro: Alert and oriented to person, place, time, and situation, CNII-XII grossly intact, no focal deficits  Psych: Normal mood and affect   ASSESSMENT:   Alice Ray is a 56 y.o. female who presents for the following: 1. Syncope and collapse   2. Palpitations   3. SVT (supraventricular tachycardia) (HCC)   4. Essential hypertension   5. Coronary artery disease of native artery of native heart with stable angina pectoris (HCC)   6. Agatston coronary artery calcium score between 100 and 199   7. Heterozygous familial  hypercholesterolemia   8. Statin myopathy     PLAN:   1. Syncope and collapse -Unclear.  Suspect this was vasovagal.  She has very normal blood pressure and is on a lot of blood pressure medication.  I recommended to reduce losartan.  She can exercise 5 miles per day without chest pain or trouble breathing.  [place].  I  believe her heart is in exquisite shape.  We will continue to monitor.  She had no further episodes.  I recommended to remain adequately hydrated and to continue to exercise.  2. Palpitations 3. SVT (supraventricular tachycardia) (HCC) -She describes sharp twinges in her chest that coincide with SVT.  We will continue with metoprolol at the increased dose 37.5 mg daily.  Symptoms have improved dramatically with this.  She will keep an eye on things moving forward.  4. Essential hypertension -Reduce losartan 25 mg daily.  May have contributed to her syncopal event.  5. Coronary artery disease of native artery of native heart with stable angina pectoris (HCC) 6. Agatston coronary artery calcium score between 100 and 199 7. Heterozygous familial hypercholesterolemia 8. Statin myopathy -Nonobstructive CAD on coronary CTA.  She is genetically positive familial hyperlipidemia.  On Repatha.  Low-dose Crestor and Zetia.  Continue.  She is due for recheck and lipids.  Entire family has been screened.  Her son needs to see a cardiologist and hopefully he will.  Insurance seems to be an issue.  Disposition: Return in about 6 months (around 07/31/2022).  Medication Adjustments/Labs and Tests Ordered: Current medicines are reviewed at length with the patient today.  Concerns regarding medicines are outlined above.  No orders of the defined types were placed in this encounter.  Meds ordered this encounter  Medications   losartan (COZAAR) 25 MG tablet    Sig: Take 1 tablet (25 mg total) by mouth daily.    Dispense:  90 tablet    Refill:  3   ezetimibe (ZETIA) 10 MG tablet    Sig: Take 1 tablet (10 mg total) by mouth daily.    Dispense:  90 tablet    Refill:  3    Patient Instructions  Medication Instructions:   DECREASE LOSARTAN TO 25 MG ONCE DAILY  *If you need a refill on your cardiac medications before your next appointment, please call your pharmacy*   Follow-Up: At [facility], you  and your health needs are our priority.  As part of our continuing mission to provide you with exceptional heart care, we have created designated Provider Care Teams.  These Care Teams include your primary Cardiologist (physician) and Advanced Practice Providers (APPs -  Physician Assistants and Nurse Practitioners) who all work together to provide you with the care you need, when you need it.  We recommend signing up for the patient portal called "MyChart".  Sign up information is provided on this After Visit Summary.  MyChart is used to connect with patients for Virtual Visits (Telemedicine).  Patients are able to view lab/test results, encounter notes, upcoming appointments, etc.  Non-urgent messages can be sent to your provider as well.   To learn more about what you can do with MyChart, go to [email or web address].    Your next appointment:   6 month(s)  The format for your next appointment:   In Person  Provider:   Reatha Harps, MD       Important Information About Sugar  Time Spent with Patient: I have spent a total of 35 minutes with patient reviewing hospital notes, telemetry, EKGs, labs and examining the patient as well as establishing an assessment and plan that was discussed with the patient.  > 50% of time was spent in direct patient care.  Signed, Lenna Gilford. Flora Lipps, MD, [facility]  [facility]  [address], [address] [place], [place] [address] [phone]  01/29/2022 9:29 AM

## 2022-01-29 ENCOUNTER — Encounter: Payer: Self-pay | Admitting: Cardiovascular Disease

## 2022-01-29 ENCOUNTER — Ambulatory Visit (INDEPENDENT_AMBULATORY_CARE_PROVIDER_SITE_OTHER): Payer: 59 | Admitting: Cardiovascular Disease

## 2022-01-29 VITALS — BP 118/82 | HR 83 | Ht 68.5 in | Wt 142.0 lb

## 2022-01-29 DIAGNOSIS — R55 Syncope and collapse: Secondary | ICD-10-CM

## 2022-01-29 DIAGNOSIS — R002 Palpitations: Secondary | ICD-10-CM

## 2022-01-29 DIAGNOSIS — G720 Drug-induced myopathy: Secondary | ICD-10-CM

## 2022-01-29 DIAGNOSIS — R931 Abnormal findings on diagnostic imaging of heart and coronary circulation: Secondary | ICD-10-CM

## 2022-01-29 DIAGNOSIS — T466X5D Adverse effect of antihyperlipidemic and antiarteriosclerotic drugs, subsequent encounter: Secondary | ICD-10-CM

## 2022-01-29 DIAGNOSIS — I25118 Atherosclerotic heart disease of native coronary artery with other forms of angina pectoris: Secondary | ICD-10-CM

## 2022-01-29 DIAGNOSIS — I471 Supraventricular tachycardia: Secondary | ICD-10-CM | POA: Diagnosis not present

## 2022-01-29 DIAGNOSIS — I10 Essential (primary) hypertension: Secondary | ICD-10-CM

## 2022-01-29 DIAGNOSIS — E7801 Familial hypercholesterolemia: Secondary | ICD-10-CM

## 2022-01-29 MED ORDER — EZETIMIBE 10 MG PO TABS: 10.0000 mg | ORAL_TABLET | Freq: Every day | ORAL | 3 refills | Status: DC

## 2022-01-29 MED ORDER — LOSARTAN POTASSIUM 25 MG PO TABS: 25.0000 mg | ORAL_TABLET | Freq: Every day | ORAL | 3 refills | Status: DC

## 2022-02-26 ENCOUNTER — Other Ambulatory Visit: Payer: Self-pay | Admitting: Cardiovascular Disease

## 2022-02-28 ENCOUNTER — Encounter: Payer: Self-pay | Admitting: Cardiovascular Disease

## 2022-03-02 ENCOUNTER — Ambulatory Visit (INDEPENDENT_AMBULATORY_CARE_PROVIDER_SITE_OTHER): Payer: No Typology Code available for payment source

## 2022-03-02 ENCOUNTER — Ambulatory Visit (INDEPENDENT_AMBULATORY_CARE_PROVIDER_SITE_OTHER): Payer: No Typology Code available for payment source | Admitting: Orthopedic Surgery

## 2022-03-02 ENCOUNTER — Encounter: Payer: Self-pay | Admitting: Orthopedic Surgery

## 2022-03-02 DIAGNOSIS — M25511 Pain in right shoulder: Secondary | ICD-10-CM

## 2022-03-02 DIAGNOSIS — D1721 Benign lipomatous neoplasm of skin and subcutaneous tissue of right arm: Secondary | ICD-10-CM

## 2022-03-02 DIAGNOSIS — G8929 Other chronic pain: Secondary | ICD-10-CM

## 2022-03-02 MED ORDER — BUPIVACAINE HCL 0.25 % IJ SOLN
0.6600 mL | INTRAMUSCULAR | Status: AC | PRN
  Administered 2022-03-02: .66 mL via INTRA_ARTICULAR

## 2022-03-02 MED ORDER — METHYLPREDNISOLONE ACETATE 40 MG/ML IJ SUSP
13.3300 mg | INTRAMUSCULAR | Status: AC | PRN
  Administered 2022-03-02: 13.33 mg via INTRA_ARTICULAR

## 2022-03-02 NOTE — Progress Notes (Signed)
Office Visit Note   Patient: Alice Ray           Date of Birth: 04-03-1966           MRN: [id number] Visit Date: 03/02/2022 Requested by: Joya Gaskins, [address] [place],  [place] [address] PCP: Joya Gaskins, FNP  Subjective: Chief Complaint  Patient presents with   Right Shoulder - Pain    HPI: Patient presents for evaluation of right shoulder pain.  She underwent lipoma excision last year.  After 2 months the lipoma recurred.  Has been having a lot of pain in the anterior aspect of the shoulder since that time.  Operative note from 03/17/2021 was reviewed along with pathology.  Has a lot of soreness with activity.  Does not take any pain medicine.  CT scan post resection is reviewed and is unremarkable for any discrete pathology.  Patient localizes most of her pain just anterior to the coracoid region.  Patient is very active.  She also has a lot of neck and back issues.              ROS: All systems reviewed are negative as they relate to the chief complaint within the history of present illness.  Patient denies  fevers or chills.   Assessment & Plan: Visit Diagnoses:  1. Chronic right shoulder pain     Plan: Impression is right shoulder pain with pain and some fullness in the area where the prior excision was performed.  Hard to say if this is a neuroma from sensory nerve that could be affected.  Alternatively there could be some recurrence of the lipoma.  Ultrasound examination of the area today demonstrates 1 little area of calcification just anterior to the coracoid process.  This area is injected today under ultrasound guidance.  I would start with that but discussed also with the patient about the reduced likelihood of success with any type of revision surgery.  I think it could be done from a technical standpoint.  Would not want to do that until about 2 months out from this injection.  Nonetheless that could be a consideration with I would say  50-50 chance of improvement.  Structurally the shoulder looks good.  Follow-up as needed.  Follow-Up Instructions: No follow-ups on file.   Orders:  Orders Placed This Encounter  Procedures   XR Shoulder Right   No orders of the defined types were placed in this encounter.     Procedures: Medium Joint Inj: R acromioclavicular on 03/02/2022 9:49 PM Indications: pain and diagnostic evaluation Details: 25 G 1.5 in needle, superior approach Medications: 13.33 mg methylPREDNISolone acetate 40 MG/ML; 0.66 mL bupivacaine 0.25 % Outcome: tolerated well, no immediate complications Procedure, treatment alternatives, risks and benefits explained, specific risks discussed. Consent was given by the patient. Immediately prior to procedure a time out was called to verify the correct patient, procedure, equipment, support staff and site/side marked as required. Patient was prepped and draped in the usual sterile fashion.       Clinical Data: No additional findings.  Objective: Vital Signs: There were no vitals taken for this visit.  Physical Exam:   Constitutional: Patient appears well-developed HEENT:  Head: Normocephalic Eyes:EOM are normal Neck: Normal range of motion Cardiovascular: Normal rate Pulmonary/chest: Effort normal Neurologic: Patient is alert Skin: Skin is warm Psychiatric: Patient has normal mood and affect   Ortho Exam: Ortho exam demonstrates tenderness to palpation of the right coracoid.  Incision is present which is well-healed.  Motor or sensory function of the arm is intact.  Shoulder range of motion is excellent with good rotator cuff strength.  Ultrasound examination demonstrates some scarred adipose tissue in that region just anterior to the coracoid.  Small calcific region is also present which measures about 3 x 3 mm.  This is also visualized on ultrasound.  Patient cannot have MRI scan due to spinal cord stimulators  Specialty Comments:  No specialty  comments available.  Imaging: No results found.   PMFS History: Patient Active Problem List   Diagnosis Date Noted   CAD (coronary artery disease) 09/26/2020   Essential hypertension 09/26/2020   Heterozygous familial hypercholesterolemia 12/14/2019   Statin myopathy 12/14/2019   Past Medical History:  Diagnosis Date   Anxiety    Arthritis    Chest wall mass    Chronic headaches    intermittant bilateral occipital headaches   Chronic interstitial cystitis    per pt last flare-up approx 02/ 2022   Chronic pain syndrome    followed by pain clinic;   knees, hips, shoulder left,  neck , lower back   Coronary artery disease    cardiologist--- dr w. 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 approxl 2012 in [place],  pt stated told unsure cause   Hyperlipidemia, mixed    Hypertension    Mild intermittent asthma    followed by pcp   S/P insertion of spinal cord stimulator    original placement for lumbar in 2001 and cervical 2004;  both generator's last change in 2019 to [facility] dual lumbar and cervical Nevro, pt has a control    Family History  Problem Relation Age of Onset   Colitis Father    Heart disease Father    Diverticulosis Sister    Colitis Paternal Aunt     Past Surgical History:  Procedure Laterality Date   ANTERIOR FUSION CERVICAL SPINE     2001  C4--5;   2002  C5--6   BREAST LUMPECTOMY Right 1997   CARPAL TUNNEL RELEASE Right 2001   COLONOSCOPY     last one 2021   CYSTOSCOPY WITH  HYDRODISTENSION AND BIOPSY     x3  1990s   ESOPHAGOGASTRODUODENOSCOPY     last one 2012 approx   [place]   per pt Rectus Hernia split repair   HIP SURGERY  1998   per pt total reconstruction [place]   L5--S1  per pt with cage   MASS EXCISION Right 03/17/2021   Procedure: EXCISION of right shoulder MASS;  Surgeon: Kinsinger, Arta Bruce, MD;  Location: [place];  Service: General;  Laterality: Right;   SEPTOPLASTY  1986   SPINAL CORD STIMULATOR BATTERY EXCHANGE  2019   per pt both lumbar and cervical generator change has happened twice priot 2019 then changed  to Nevro  [facility] in  [place])   SPINAL CORD STIMULATOR INSERTION     2001 lumbar;   2004 cervical   VAGINAL HYSTERECTOMY  1989   VOCAL CORD INJECTION Left    x2  appprox 2012 for left partial paresis  (in [place])   Social History   Occupational History   Not on file  Tobacco Use   Smoking status: Former    Years: 25.00    Types: Cigarettes    Quit date: 2015    Years since quitting: 8.5   Smokeless tobacco: Never  Vaping Use   Vaping Use: Never used  Substance and Sexual Activity   Alcohol use: Never   Drug use: Never   Sexual activity: Not on file

## 2022-03-21 ENCOUNTER — Encounter: Payer: Self-pay | Admitting: Orthopedic Surgery

## 2022-03-21 ENCOUNTER — Encounter: Payer: Self-pay | Admitting: Cardiovascular Disease

## 2022-03-21 ENCOUNTER — Ambulatory Visit (INDEPENDENT_AMBULATORY_CARE_PROVIDER_SITE_OTHER): Payer: Self-pay | Admitting: Orthopedic Surgery

## 2022-03-21 DIAGNOSIS — G8929 Other chronic pain: Secondary | ICD-10-CM

## 2022-03-21 DIAGNOSIS — M25511 Pain in right shoulder: Secondary | ICD-10-CM

## 2022-03-21 NOTE — Progress Notes (Unsigned)
Office Visit Note   Patient: Alice Ray           Date of Birth: Dec 30, 1965           MRN: [id number] Visit Date: 03/21/2022 Requested by: Joya Gaskins, [address] [place],  [place] [address] PCP: Joya Gaskins, FNP  Subjective: Chief Complaint  Patient presents with   Right Shoulder - Pain    HPI: Patient presents for follow-up of right shoulder injection.  She actually is about 70% better from the injection.  Not bothering her to sleep at night.  We did an ultrasound-guided injection in that area of maximal tenderness near the coracoid process.  CT scan is reviewed.  No definite spurring noted around the coracoid process.  This is an area where prior lipoma excision was performed.              ROS: All systems reviewed are negative as they relate to the chief complaint within the history of present illness.  Patient denies  fevers or chills.   Assessment & Plan: Visit Diagnoses:  1. Chronic right shoulder pain     Plan: Plan at this time is observation and consideration of 1 more injection prior to any type of surgical intervention.  For now the symptoms are livable for Alice Ray.  She will follow-up as needed.  Follow-Up Instructions: No follow-ups on file.   Orders:  No orders of the defined types were placed in this encounter.  No orders of the defined types were placed in this encounter.     Procedures: No procedures performed   Clinical Data: No additional findings.  Objective: Vital Signs: There were no vitals taken for this visit.  Physical Exam:   Constitutional: Patient appears well-developed HEENT:  Head: Normocephalic Eyes:EOM are normal Neck: Normal range of motion Cardiovascular: Normal rate Pulmonary/chest: Effort normal Neurologic: Patient is alert Skin: Skin is warm Psychiatric: Patient has normal mood and affect   Ortho Exam: Ortho exam demonstrates full active and passive range of motion of the right  shoulder.  Mild tenderness around the coracoid.  No fluctuance erythema and she has very good rotator cuff function and strength in the right shoulder.  Specialty Comments:  No specialty comments available.  Imaging: No results found.   PMFS History: Patient Active Problem List   Diagnosis Date Noted   CAD (coronary artery disease) 09/26/2020   Essential hypertension 09/26/2020   Heterozygous familial hypercholesterolemia 12/14/2019   Statin myopathy 12/14/2019   Past Medical History:  Diagnosis Date   Anxiety    Arthritis    Chest wall mass    Chronic headaches    intermittant bilateral occipital headaches   Chronic interstitial cystitis    per pt last flare-up approx 02/ 2022   Chronic pain syndrome    followed by pain clinic;   knees, hips, shoulder left,  neck , lower back   Coronary artery disease    cardiologist--- dr w. Audie Box;  nonobstructive cad per [facility] 12-14-2019   GERD (gastroesophageal reflux disease)    Heterozygous familial hypercholesterolemia    History of 2019 novel coronavirus disease (COVID-19) 07/26/2020   positive result in epic, per pt moderate symptoms that resolved   History of gastric ulcer    per pt many yrs ago due to chronic nsaids   History of ischemic colitis    per pt told ischemic colitis 2021,  stated completed resolved   History of vocal cord paralysis  left partial vocal cord paresis s/p injection x2 approxl 2012 in [place],  pt stated told unsure cause   Hyperlipidemia, mixed    Hypertension    Mild intermittent asthma    followed by pcp   S/P insertion of spinal cord stimulator    original placement for lumbar in 2001 and cervical 2004;  both generator's last change in 2019 to [facility] Jude dual lumbar and cervical Nevro, pt has a control    Family History  Problem Relation Age of Onset   Colitis Father    Heart disease Father    Diverticulosis Sister    Colitis Paternal Aunt     Past Surgical History:  Procedure Laterality Date    ANTERIOR FUSION CERVICAL SPINE     2001  C4--5;   2002  C5--6   BREAST LUMPECTOMY Right 1997   CARPAL TUNNEL RELEASE Right 2001   COLONOSCOPY     last one 2021   CYSTOSCOPY WITH HYDRODISTENSION AND BIOPSY     x3  1990s   ESOPHAGOGASTRODUODENOSCOPY     last one 2012 approx   [place]   per pt Rectus Hernia split repair   [place]   per pt total reconstruction due to [place]   [place]   L5--S1  per pt with cage   MASS EXCISION Right 03/17/2021   Procedure: EXCISION of right shoulder MASS;  Surgeon: Mickeal Skinner, MD;  Location: [facility];  Service: General;  Laterality: Right;   [place]  2019   per pt both lumbar and cervical generator change has happened twice priot 2019 then changed  to Nevro  [facility] in [place])   [place]     2001 lumbar;   2004 cervical   [place] INJECTION Left    x2  appprox 2012 for left partial paresis  (in [place])   Social History   Occupational History   Not on file  Tobacco Use   Smoking status: Former    Years: 25.00    Types: Cigarettes    Quit date: 2015    Years since quitting: 8.6   Smokeless tobacco: Never  Vaping Use   Vaping Use: Never used  Substance and Sexual Activity   Alcohol use: Never   Drug use: Never   Sexual activity: Not on file

## 2022-05-14 ENCOUNTER — Encounter: Payer: Self-pay | Admitting: Cardiovascular Disease

## 2022-05-15 MED ORDER — METOPROLOL SUCCINATE ER 25 MG PO TB24
37.5000 mg | ORAL_TABLET | Freq: Every evening | ORAL | 3 refills | Status: AC
Start: 2022-05-15 — End: ?

## 2022-07-25 NOTE — Telephone Encounter (Signed)
Phone call

## 2022-08-16 ENCOUNTER — Encounter: Payer: Self-pay | Admitting: Cardiovascular Disease

## 2022-10-17 ENCOUNTER — Other Ambulatory Visit (HOSPITAL_COMMUNITY): Payer: Self-pay

## 2022-10-18 ENCOUNTER — Telehealth: Payer: Self-pay

## 2022-10-18 NOTE — Telephone Encounter (Addendum)
Pharmacy Patient Advocate Encounter  Prior Authorization for  Repatha Pushtronex System 420 mg/3.5 mL  has been Approved by [employer] [employer]    TRACKING INFO # [id number] Effective dates: 3.13.24 through 3.13.25

## 2022-10-21 NOTE — Progress Notes (Unsigned)
Cardiology Office Note:   Date:  10/25/2022  NAME:  Alice Ray    MRN: [id number] DOB:  1965-09-16   PCP:  Joya Gaskins, FNP  Cardiologist:  Evalina Field, MD  Electrophysiologist:  None   Referring MD: Joya Gaskins, *   Chief Complaint  Patient presents with   Follow-up   History of Present Illness:   [name] is a 57 y.o. female with a hx of FH, CAD, HLD, HTN who presents for follow-up.  She reports having episodes of dizziness with activity.  She is doing lots of running and sprints.  Blood pressure 120/60.  Still having episodes of palpitations.  Occur 1-2 times a week.  Last seconds.  Doing well on metoprolol.  Her LDL cholesterol is at goal.  She describes no symptoms of angina.  She does have significant arthritis related to prior injury.  She reports she is going to undergo ketamine infusions for pain control.  CV examination unremarkable.  Overall doing quite well.  Problem List 1. Heterozygous familial hypercholesterolemia -LDLR positive  -statin intolerant -on repatha -T chol 146, LDL 54, TG 54, HDL 76 2. CAD -CAC score 105 (96th percentile) -mild (25-49%) LAD plaque 3. HTN  Past Medical History: Past Medical History:  Diagnosis Date   Anxiety    Arthritis    Chest wall mass    Chronic headaches    intermittant bilateral occipital headaches   Chronic interstitial cystitis    per pt last flare-up approx 02/ 2022   Chronic pain syndrome    followed by pain clinic;   knees, hips, shoulder left,  neck , lower back   Coronary artery disease    cardiologist--- dr w. Audie Box;  nonobstructive cad per [facility] 12-14-2019   GERD (gastroesophageal reflux disease)    Heterozygous familial hypercholesterolemia    History of 2019 novel coronavirus disease (COVID-19) 07/26/2020   positive result in epic, per pt moderate symptoms that resolved   History of gastric ulcer    per pt many yrs ago due to chronic nsaids   History of ischemic  colitis    per pt told ischemic colitis 2021,  stated completed resolved   History of vocal cord paralysis    left partial vocal cord paresis s/p injection x2 approxl 2012 in [place],  pt stated told unsure cause   Hyperlipidemia, mixed    Hypertension    Mild intermittent asthma    followed by pcp   S/P insertion of spinal cord stimulator    original placement for lumbar in 2001 and cervical 2004;  both generator's last change in 2019 to [facility] Jude dual lumbar and cervical Nevro, pt has a control    Past Surgical History: Past Surgical History:  Procedure Laterality Date   ANTERIOR FUSION CERVICAL SPINE     2001  C4--5;   2002  C5--6   BREAST LUMPECTOMY Right 1997   CARPAL TUNNEL RELEASE Right 2001   COLONOSCOPY     last one 2021   CYSTOSCOPY WITH HYDRODISTENSION AND BIOPSY     x3  1990s   ESOPHAGOGASTRODUODENOSCOPY     last one 2012 approx   HERNIA REPAIR  1991   per pt Rectus Hernia split repair   HIP SURGERY  1998   per pt total reconstruction due to injury from fall [place]   [place]   L5--S1  per pt with cage   MASS EXCISION Right 03/17/2021   Procedure: EXCISION of  right shoulder MASS;  Surgeon: Kinsinger, Arta Bruce, MD;  Location: [facility];  Service: General;  Laterality: Right;   SEPTOPLASTY  1986   SPINAL CORD STIMULATOR BATTERY EXCHANGE  2019   per pt both lumbar and cervical generator change has happened twice priot 2019 then changed  to Nevro  [facility] in [place])   SPINAL CORD STIMULATOR INSERTION     2001 lumbar;   2004 cervical   VAGINAL HYSTERECTOMY  1989   VOCAL CORD INJECTION Left    x2  appprox 2012 for left partial paresis  (in [place])    Current Medications: Current Meds  Medication Sig   albuterol (VENTOLIN HFA) 108 (90 Base) MCG/ACT inhaler Inhale 2 puffs into the lungs as needed for wheezing or shortness of breath.   aspirin EC 81 MG tablet Take 81 mg by mouth daily. Swallow whole.   B Complex Vitamins (VITAMIN B  COMPLEX PO) Take by mouth daily.   Bacillus Coagulans-Inulin (PROBIOTIC-PREBIOTIC PO) Take by mouth daily.   Cholecalciferol (VITAMIN D3 PO) Take by mouth daily.   diclofenac Sodium (VOLTAREN) 1 % GEL Apply 1 application topically 4 (four) times daily as needed (joint pain).    EPINEPHrine 0.3 mg/0.3 mL IJ SOAJ injection Inject 0.3 mg into the muscle Once PRN for anaphylaxis.   ezetimibe (ZETIA) 10 MG tablet Take 1 tablet (10 mg total) by mouth daily.   ibuprofen (ADVIL) 800 MG tablet Take 1 tablet (800 mg total) by mouth every 8 (eight) hours as needed.   LORazepam (ATIVAN) 1 MG tablet Take 1 mg by mouth daily as needed for anxiety.    metoprolol succinate (TOPROL-XL) 25 MG 24 hr tablet Take 1.5 tablets (37.5 mg total) by mouth every evening.   montelukast (SINGULAIR) 10 MG tablet Take 10 mg by mouth daily.   Omega-3 Fatty Acids (FISH OIL PO) Take by mouth daily.   pantoprazole (PROTONIX) 40 MG tablet Take 40 mg by mouth every morning.   [place] 420 MG/3.5ML SOCT INJECT 420 MG INTO THE SKIN EVERY 30 (THIRTY) DAYS.   rosuvastatin (CRESTOR) 5 MG tablet Take 5 mg by mouth 2 (two) times a week. Monday and Friday   [DISCONTINUED] losartan (COZAAR) 25 MG tablet Take 1 tablet (25 mg total) by mouth daily.     Allergies:    Penicillins   Social History: Social History   Socioeconomic History   Marital status: Married    Spouse name: Not on file   Number of children: 3   Years of education: Not on file   Highest education level: Not on file  Occupational History   Not on file  Tobacco Use   Smoking status: Former    Years: 25    Types: Cigarettes    Quit date: 2015    Years since quitting: 9.2   Smokeless tobacco: Never  Vaping Use   Vaping Use: Never used  Substance and Sexual Activity   Alcohol use: Never   Drug use: Never   Sexual activity: Not on file  Other Topics Concern   Not on file  Social History Narrative   Not on file   Social Determinants of  Health   Financial Resource Strain: Not on file  Food Insecurity: Not on file  Transportation Needs: Not on file  Physical Activity: Not on file  Stress: Not on file  Social Connections: Not on file     Family History: The patient's family history includes Colitis in her father  and paternal aunt; Diverticulosis in her sister; Heart disease in her father.  ROS:   All other ROS reviewed and negative. Pertinent positives noted in the HPI.     EKGs/Labs/Other Studies Reviewed:   The following studies were personally reviewed by me today:  Recent Labs: 12/06/2021: BUN 21; Creatinine, Ser 0.78; Hemoglobin 14.3; Platelets 297; Potassium 4.3; Sodium 141; TSH 2.490   Recent Lipid Panel    Component Value Date/Time   CHOL 201 (H) 01/19/2021 1006   TRIG 60 01/19/2021 1006   HDL 79 01/19/2021 1006   CHOLHDL 2.5 01/19/2021 1006   LDLCALC 111 (H) 01/19/2021 1006    Physical Exam:   VS:  BP 120/60   Pulse 77   Ht 5' 8.5" (1.74 m)   Wt 146 lb (66.2 kg)   SpO2 99%   BMI 21.88 kg/m    Wt Readings from Last 3 Encounters:  10/25/22 146 lb (66.2 kg)  01/29/22 142 lb (64.4 kg)  12/05/21 143 lb 3.2 oz (65 kg)    General: Well nourished, well developed, in no acute distress Head: Atraumatic, normal size  Eyes: PEERLA, EOMI  Neck: Supple, no JVD Endocrine: No thryomegaly Cardiac: Normal S1, S2; RRR; no murmurs, rubs, or gallops Lungs: Clear to auscultation bilaterally, no wheezing, rhonchi or rales  Abd: Soft, nontender, no hepatomegaly  Ext: No edema, pulses 2+ Musculoskeletal: No deformities, BUE and BLE strength normal and equal Skin: Warm and dry, no rashes   Neuro: Alert and oriented to person, place, time, and situation, CNII-XII grossly intact, no focal deficits  Psych: Normal mood and affect   ASSESSMENT:   GAYL ANSCHUTZ is a 57 y.o. female who presents for the following: 1. Coronary artery disease of native artery of native heart with stable angina pectoris ([place])   2.  Agatston coronary artery calcium score between 100 and 199   3. Heterozygous familial hypercholesterolemia     PLAN:   1. Coronary artery disease of native artery of native heart with stable angina pectoris ([place]) 2. Agatston coronary artery calcium score between 100 and 199 3. Heterozygous familial hypercholesterolemia -Nonobstructive CAD.  Positive for familial hyperlipidemia.  On Repatha.  Cannot tolerate statins.  Most recent LDL is 54.  Will continue aspirin 81 mg daily.  Overall doing well.  Does report some dizziness.  Suspect on too much blood pressure medication.  I would like for her to stop her losartan check her blood pressure every other day.  She should keep an eye on her symptoms.  She should remain hydrated.  She is on metoprolol for SVT.  Well-controlled.  Continue this.  Disposition: Return in about 1 year (around 10/25/2023).  Medication Adjustments/Labs and Tests Ordered: Current medicines are reviewed at length with the patient today.  Concerns regarding medicines are outlined above.  No orders of the defined types were placed in this encounter.  No orders of the defined types were placed in this encounter.   Patient Instructions  Medication Instructions:  STOP Losartan   *If you need a refill on your cardiac medications before your next appointment, please call your pharmacy*   Follow-Up: At [facility], you and your health needs are our priority.  As part of our continuing mission to provide you with exceptional heart care, we have created designated Provider Care Teams.  These Care Teams include your primary Cardiologist (physician) and Advanced Practice Providers (APPs -  Physician Assistants and Nurse Practitioners) who all work together to provide you with  the care you need, when you need it.  We recommend signing up for the patient portal called "MyChart".  Sign up information is provided on this After Visit Summary.  MyChart is used to connect with  patients for Virtual Visits (Telemedicine).  Patients are able to view lab/test results, encounter notes, upcoming appointments, etc.  Non-urgent messages can be sent to your provider as well.   To learn more about what you can do with MyChart, go to [email or web address].    Your next appointment:   12 month(s)  Provider:   Evalina Field, MD         Time Spent with Patient: I have spent a total of 25 minutes with patient reviewing hospital notes, telemetry, EKGs, labs and examining the patient as well as establishing an assessment and plan that was discussed with the patient.  > 50% of time was spent in direct patient care.  Signed, Addison Naegeli. Audie Box, MD, [place]  [address], [place], [place] [address] [phone]  10/25/2022 11:26 AM

## 2022-10-25 ENCOUNTER — Encounter: Payer: Self-pay | Admitting: Cardiovascular Disease

## 2022-10-25 ENCOUNTER — Ambulatory Visit
Payer: No Typology Code available for payment source | Attending: Cardiovascular Disease | Admitting: Cardiovascular Disease

## 2022-10-25 VITALS — BP 120/60 | HR 77 | Ht 68.5 in | Wt 146.0 lb

## 2022-10-25 DIAGNOSIS — R931 Abnormal findings on diagnostic imaging of heart and coronary circulation: Secondary | ICD-10-CM

## 2022-10-25 DIAGNOSIS — I25118 Atherosclerotic heart disease of native coronary artery with other forms of angina pectoris: Secondary | ICD-10-CM

## 2022-10-25 DIAGNOSIS — E7801 Familial hypercholesterolemia: Secondary | ICD-10-CM | POA: Diagnosis not present

## 2022-10-25 NOTE — Patient Instructions (Signed)
Medication Instructions:  STOP Losartan   *If you need a refill on your cardiac medications before your next appointment, please call your pharmacy*   Follow-Up: At [facility], you and your health needs are our priority.  As part of our continuing mission to provide you with exceptional heart care, we have created designated Provider Care Teams.  These Care Teams include your primary Cardiologist (physician) and Advanced Practice Providers (APPs -  Physician Assistants and Nurse Practitioners) who all work together to provide you with the care you need, when you need it.  We recommend signing up for the patient portal called "MyChart".  Sign up information is provided on this After Visit Summary.  MyChart is used to connect with patients for Virtual Visits (Telemedicine).  Patients are able to view lab/test results, encounter notes, upcoming appointments, etc.  Non-urgent messages can be sent to your provider as well.   To learn more about what you can do with MyChart, go to [email or web address].    Your next appointment:   12 month(s)  Provider:   Evalina Field, MD

## 2022-11-27 ENCOUNTER — Encounter: Payer: Self-pay | Admitting: Cardiovascular Disease

## 2023-01-08 ENCOUNTER — Other Ambulatory Visit: Payer: Self-pay | Admitting: Cardiovascular Disease

## 2023-01-08 DIAGNOSIS — E7801 Familial hypercholesterolemia: Secondary | ICD-10-CM

## 2023-01-15 ENCOUNTER — Other Ambulatory Visit: Payer: Self-pay | Admitting: Cardiovascular Disease

## 2023-01-15 DIAGNOSIS — I10 Essential (primary) hypertension: Secondary | ICD-10-CM

## 2023-01-20 ENCOUNTER — Other Ambulatory Visit: Payer: Self-pay | Admitting: Cardiovascular Disease

## 2023-01-21 ENCOUNTER — Telehealth: Payer: Self-pay | Admitting: Pharmacist

## 2023-01-21 ENCOUNTER — Other Ambulatory Visit: Payer: Self-pay | Admitting: Cardiovascular Disease

## 2023-01-21 ENCOUNTER — Telehealth: Payer: Self-pay

## 2023-01-21 ENCOUNTER — Other Ambulatory Visit (HOSPITAL_COMMUNITY): Payer: Self-pay

## 2023-01-21 MED ORDER — REPATHA SURECLICK 140 MG/ML ~~LOC~~ SOAJ: 140.0000 mg | SUBCUTANEOUS | 3 refills | Status: DC

## 2023-01-21 NOTE — Telephone Encounter (Signed)
Pharmacy Patient Advocate Encounter   Received notification from [facility] that prior authorization for REPATHA 140MG /ML is required/requested.   PA submitted to [facility] via CoverMyMeds Key or (Medicaid) confirmation # [id number]   Status is pending

## 2023-01-21 NOTE — Telephone Encounter (Signed)
Repatha pushtrox refill requested. Call to inform formulation d/c by [employer] so changing it to Q14d injection. N/A LVM to call back.

## 2023-01-24 MED ORDER — REPATHA SURECLICK 140 MG/ML ~~LOC~~ SOAJ: 140.0000 mg | SUBCUTANEOUS | 3 refills | Status: DC

## 2023-01-24 NOTE — Telephone Encounter (Signed)
Pt is aware of approval and switch to Sureclick. Rx sent again to pharmacy.

## 2023-03-16 ENCOUNTER — Encounter: Payer: Self-pay | Admitting: Cardiovascular Disease

## 2023-03-20 IMAGING — CT CT SHOULDER*R* W/O CM
3 of 4 series · 15 of 33 positions shown, 18 images · non-contrast
Comparison: Ultrasound 12/20/2020

CLINICAL DATA: Prior subclavicular lipoma removal in March 2021

EXAM:
CT OF THE UPPER RIGHT EXTREMITY WITHOUT CONTRAST
TECHNIQUE: Multidetector CT imaging of the upper right extremity was performed
according to the standard protocol.

[Series 6: shoulder 2.00 br40 s3 cor soft · coronal · 0.46mm/px · 3 of 105 slices shown]
[im 21/105  bone]
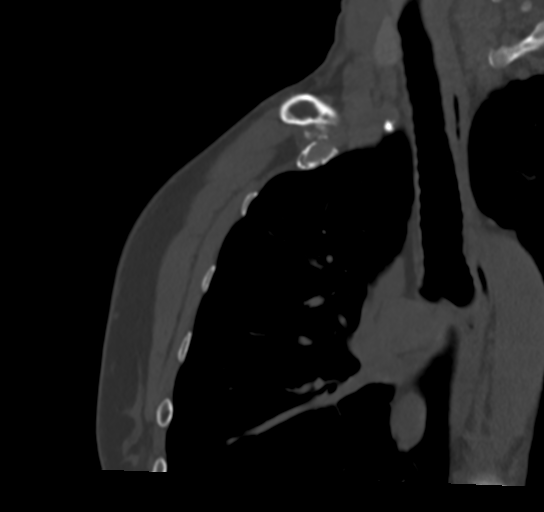
[im 42/105  bone]
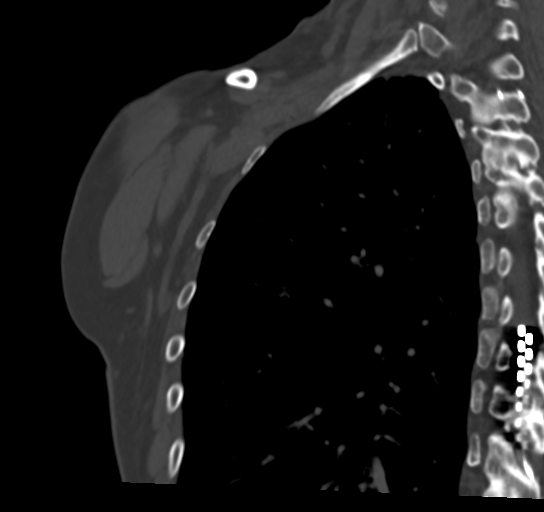
[im 63/105  bone]
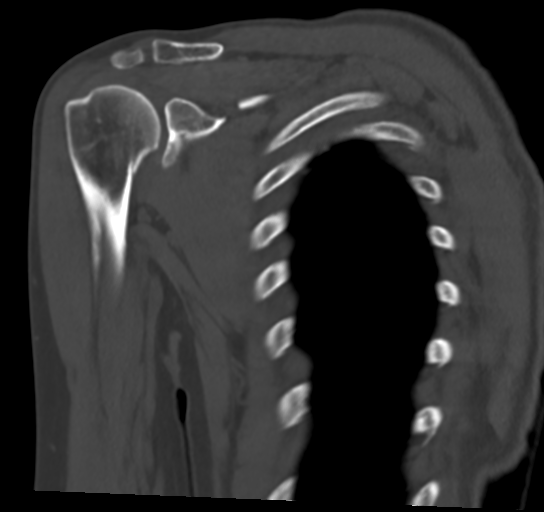

[Series 10: shoulder 2.00 br40 s3 sag soft · sagittal · 0.41mm/px · 5 of 124 slices shown, 6 images]
[im 42/124  bone]
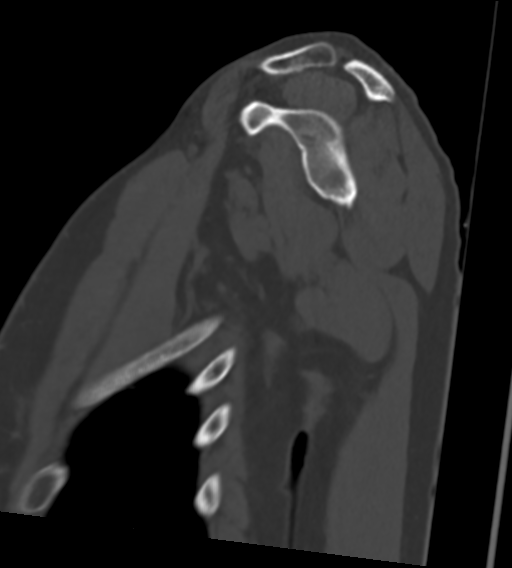
[im 52/124  bone]
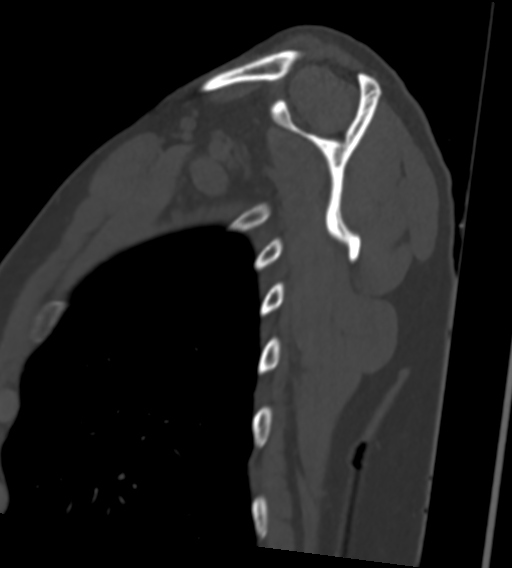
[im 62/124  soft-tissue]
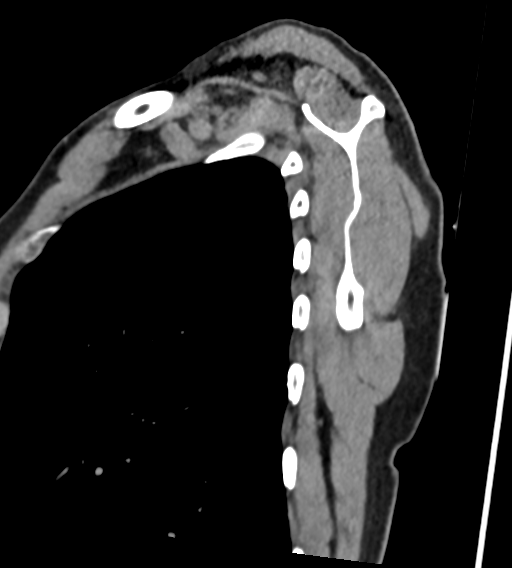
[im 62/124  bone]
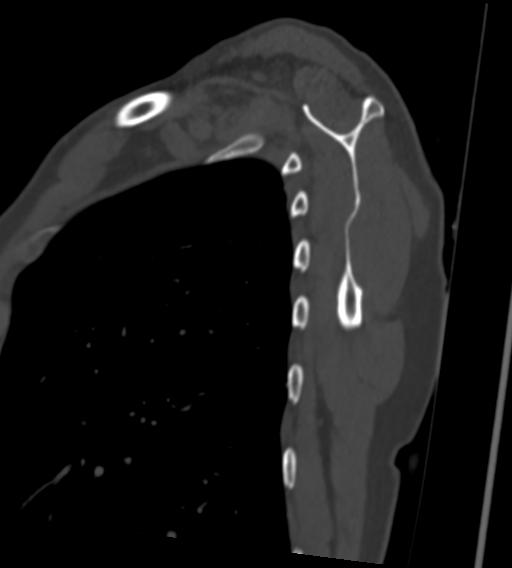
[im 72/124  bone]
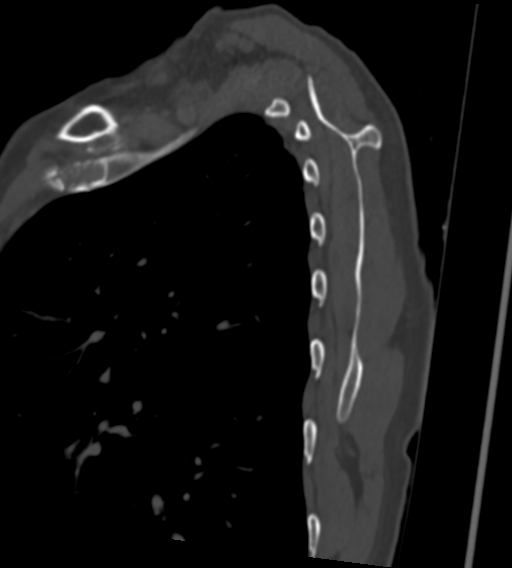
[im 83/124  bone]
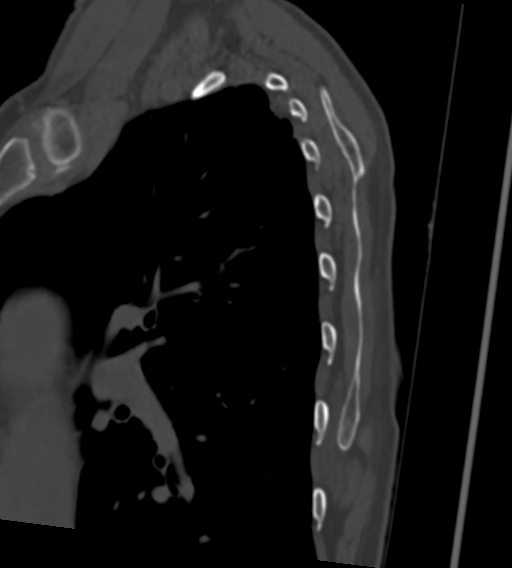

[Series 14: shoulder 0.60 br40 s3 thins soft · axial · 0.55mm/px · z∈[-1167,-985]mm · 7 of 380 slices shown, 9 images]
[im 38/380  soft-tissue]
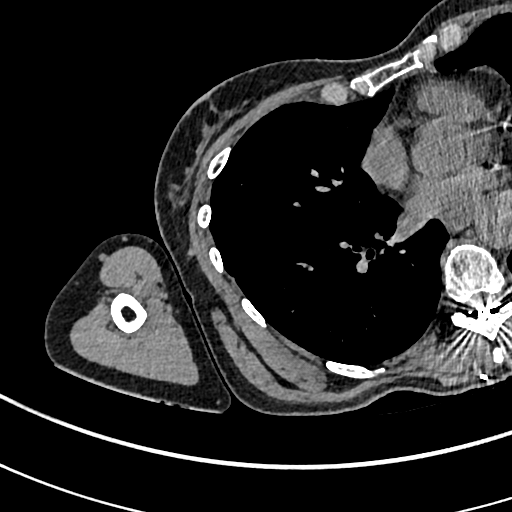
[im 38/380  bone]
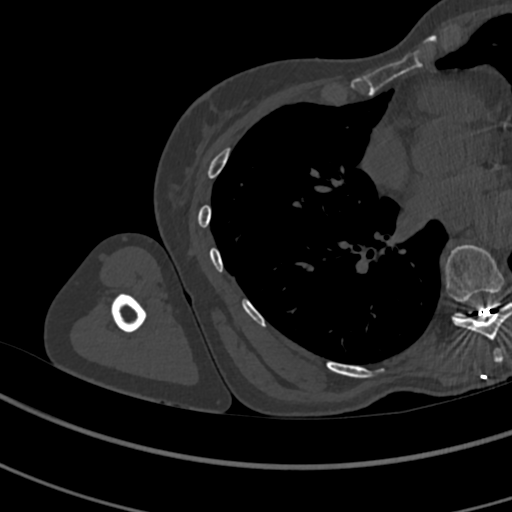
[im 76/380  bone]
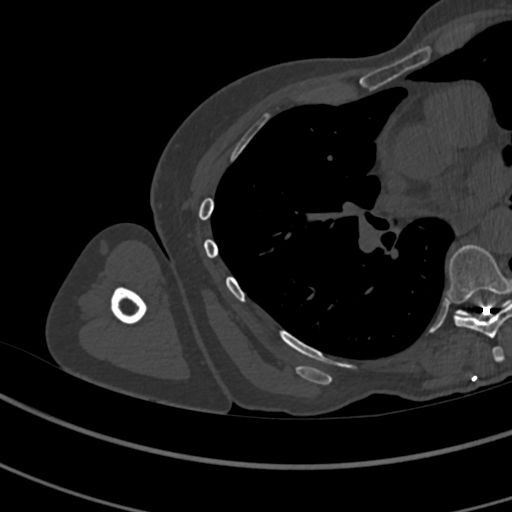
[im 152/380  bone]
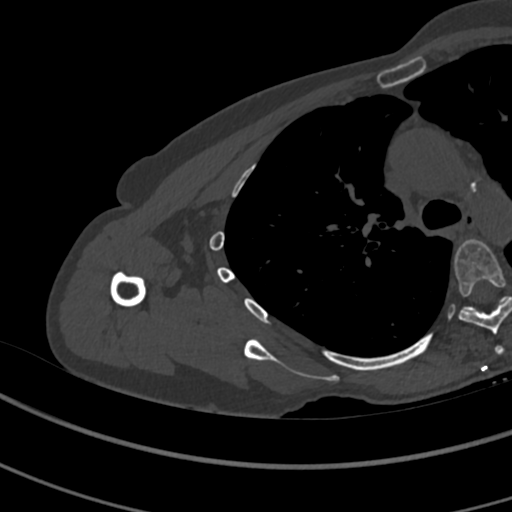
[im 190/380  bone]
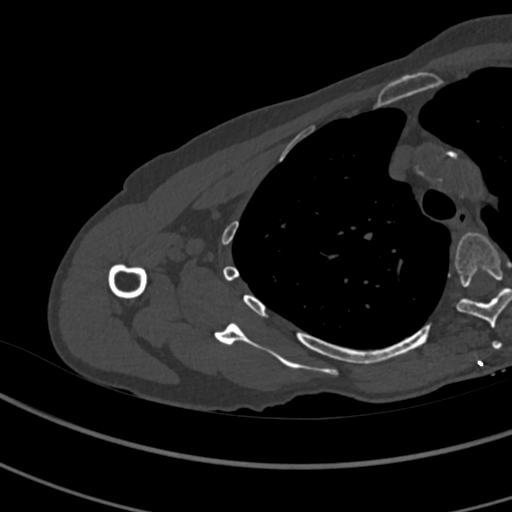
[im 228/380  soft-tissue]
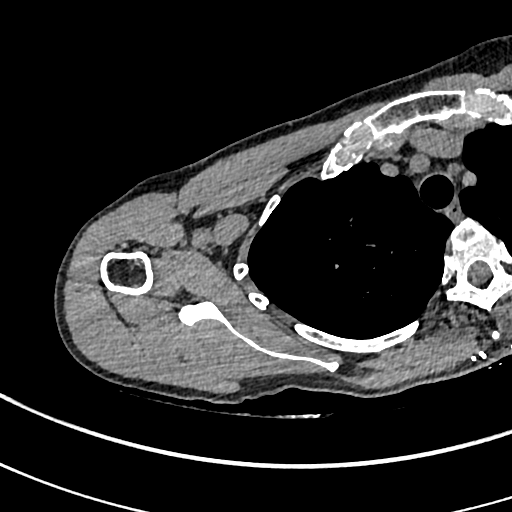
[im 228/380  bone]
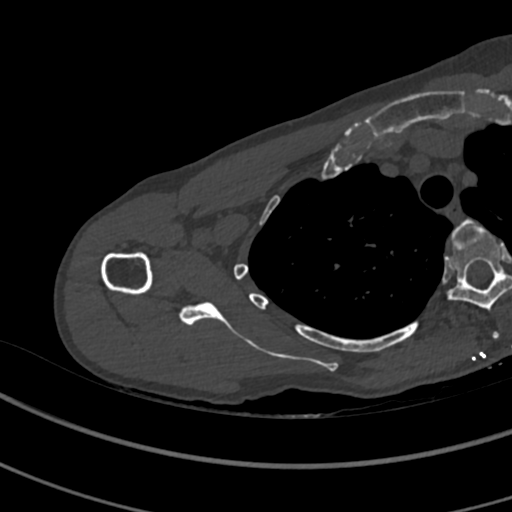
[im 304/380  bone]
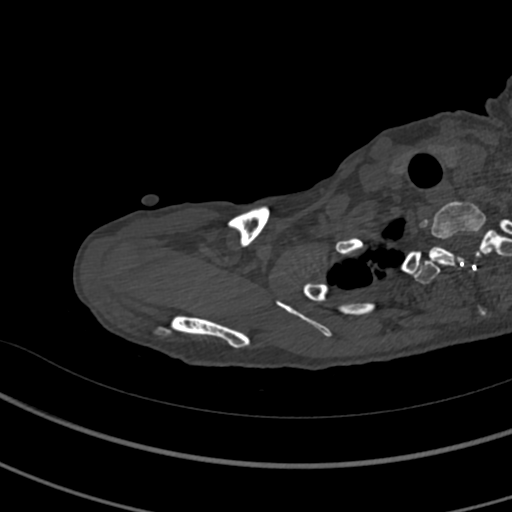
[im 342/380  bone]
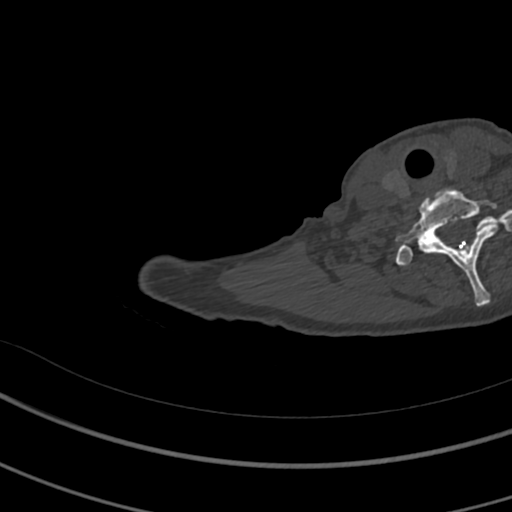

[15 of 33 positions shown; findings below may reference images not displayed]

FINDINGS: Bones/Joint/Cartilage

Mild chondrocalcinosis along the sternoclavicular joints.

Ligaments

Suboptimally assessed by CT.

Muscles and Tendons

No regional muscular atrophy.

Soft tissues

The postoperative region is delineated with a vitamin-E capsule. The
underlying subcutaneous tissues a reasonably thin at about 0.3 cm,
potentially with some minimal scarring in this vicinity from prior
resection. The underlying anterior deltoid appears unremarkable. No
new masses identified.

Neural stimulator noted extending cephalad in the cervical region
dorsal to the spinal cord within the spinal canal. There also
appears to be neural stimulator leads at the T7-T8-T9 level likely
from a separate neural stimulator device.

Coronary, aortic arch, and branch vessel atherosclerotic vascular
disease. Right apical scarring. Centrilobular emphysema. Mild airway
thickening on the right side.
IMPRESSION: 1. No recurrent mass identified. Very minimal scarring in the
subcutaneous tissues along the resection region. The underlying
deltoid muscle appears normal.
2. Neural stimulators noted.
3.  Aortic Atherosclerosis (875AV-1M8.8).  Coronary atherosclerosis.
4. Emphysema (875AV-4GF.J). Airway thickening is present, suggesting
bronchitis or reactive airways disease.

## 2023-04-22 ENCOUNTER — Encounter: Payer: Self-pay | Admitting: Cardiovascular Disease

## 2023-04-22 DIAGNOSIS — E7801 Familial hypercholesterolemia: Secondary | ICD-10-CM

## 2023-04-22 MED ORDER — EZETIMIBE 10 MG PO TABS
10.0000 mg | ORAL_TABLET | Freq: Every day | ORAL | 3 refills | Status: AC
Start: 2023-04-22 — End: ?

## 2023-04-23 ENCOUNTER — Other Ambulatory Visit (HOSPITAL_COMMUNITY): Payer: Self-pay

## 2023-04-23 ENCOUNTER — Encounter: Payer: Self-pay | Admitting: Pharmacist

## 2023-04-23 ENCOUNTER — Telehealth: Payer: Self-pay | Admitting: Cardiovascular Disease

## 2023-04-23 MED ORDER — PRALUENT 75 MG/ML ~~LOC~~ SOAJ: 75.0000 mg | SUBCUTANEOUS | 3 refills | Status: DC

## 2023-04-23 NOTE — Telephone Encounter (Signed)
Prior auth approved through 04/22/24. Pt prefers rx be sent in as 3 month supply, ok with $150 copay using Praluent copay card. I have set her up with one and sent her info via mychart message. She was very appreciative for the assistance.

## 2023-04-23 NOTE — Telephone Encounter (Signed)
Returned call to pt. Sounds like she has a high deductible plan and her Repatha copay card runs out halfway through the year. Her last 3 month rx was $15, had to change pharmacies recently, gave copay card info to new pharmacy, they stated copay was > $500. She called [employer] this morning who told her she used all the funding on her copay card and that it wouldn't reset until January 1.  Not sure if she'd be covered with [employer] and viewed as "underinsured" due to poor insurance plan/coverage for Repatha. I will first try submitting a prior auth for Praluent instead as she could change over to similar product and get set up with their $50 copay card and use that until her Repatha card resets in January. Key [id number].

## 2023-04-23 NOTE — Telephone Encounter (Signed)
Pt c/o medication issue:  1. Name of Medication:   Evolocumab (REPATHA SURECLICK) 140 MG/ML SOAJ   2. How are you currently taking this medication (dosage and times per day)?   As prescribed  3. Are you having a reaction (difficulty breathing--STAT)?   No  4. What is your medication issue?   Patient stated she wants a call back to discuss questions she has about this prescription.

## 2023-05-07 ENCOUNTER — Other Ambulatory Visit: Payer: Self-pay

## 2023-05-07 MED ORDER — METOPROLOL SUCCINATE ER 25 MG PO TB24: 37.5000 mg | ORAL_TABLET | Freq: Every evening | ORAL | 1 refills | Status: AC

## 2023-11-17 NOTE — Progress Notes (Unsigned)
 Cardiology Office Note:  .   Date:  11/18/2023  ID:  Alice Ray, Alice Ray 1966/02/15, MRN [id number] PCP: Trisha Mangle, FNP   [facility] Providers Cardiologist:  Reatha Harps, MD { History of Present Illness: .    Chief Complaint  Patient presents with   Follow-up    Alice Ray is a 58 y.o. female with history of CAD, FH, HLD who presents for follow-up.    History of Present Illness   Alice Ray "Ineq" is a 58 year old female with nonobstructive CAD and familial hyperlipidemia who presents for follow-up.  She experiences episodes of supraventricular tachycardia (SVT) a couple of times a week, each lasting a few seconds. These episodes are described as her heart not beating correctly, causing her to stop whatever she is doing. She is currently on metoprolol, taking a tablet and a half, but has had issues with pharmacy supply, occasionally running out of her medication. The increase in metoprolol dosage has not significantly improved her symptoms. No excessive caffeine consumption is noted.  She has a history of familial hyperlipidemia and is currently on Praluent, which causes intermittent gastrointestinal discomfort. She was previously on Repatha and is considering switching back due to better tolerance. Her most recent LDL cholesterol level was 68 mg/dL. She also takes rosuvastatin 5 mg Monday through Friday and Zetia 10 mg daily, along with an 81 mg aspirin daily.  She has nonobstructive coronary artery disease with no current blockages on CT scan. She maintains a high level of physical activity and her blood pressure is well-controlled.  She experiences significant leg pain, attributed to her spine issues from a past accident. Despite this, she maintains a high level of physical activity, including walking and wind sprints. Previous vascular studies, including ABIs and ultrasounds, showed normal blood flow to her legs.  She reports poor sleep  recently, feeling 'completely strung out' and attributes this to stress, including family changes such as moving back to the family farm and taking care of two 36 year old grandchildren. She drinks decaffeinated iced tea and two cups of coffee daily, ensuring adequate hydration. No new chest pain or trouble breathing.          Problem List 1. Heterozygous familial hypercholesterolemia -LDLR positive  -statin intolerant -on repatha -T chol 164, TG 86, HDL 79, LDL 68 2. CAD -CAC score 105 (96th percentile) -mild (25-49%) LAD plaque 3. HTN    ROS: All other ROS reviewed and negative. Pertinent positives noted in the HPI.     Studies Reviewed: Marland Kitchen   EKG Interpretation Date/Time:  Monday November 18 2023 10:15:55 EDT Ventricular Rate:  56 PR Interval:  184 QRS Duration:  84 QT Interval:  406 QTC Calculation: 391 R Axis:   -53  Text Interpretation: Sinus bradycardia Left axis deviation Confirmed by Lennie Odor [phone]) on 11/18/2023 10:20:28 AM   CCTA 12/14/2019 IMPRESSION: 1. Coronary calcium score of 105. This was 96th percentile for age and sex matched controls.   2. Normal coronary origin with right dominance.   3. Mild, non-calcified plaque in the mid LAD (25-49%).   4. Diffuse minimal plaque (<25%) in the RCA/LCX (<25%).   5. Small PFO. Physical Exam:   VS:  BP 124/88 (BP Location: Left Arm, Patient Position: Sitting)   Pulse (!) 56   Ht 5\' 8"  (1.727 m)   Wt 149 lb 12.8 oz (67.9 kg)   SpO2 98%   BMI 22.78 kg/m    Wt Readings from Last  3 Encounters:  11/18/23 149 lb 12.8 oz (67.9 kg)  10/25/22 146 lb (66.2 kg)  01/29/22 142 lb (64.4 kg)    GEN: Well nourished, well developed in no acute distress NECK: No JVD; No carotid bruits CARDIAC: RRR, no murmurs, rubs, gallops RESPIRATORY:  Clear to auscultation without rales, wheezing or rhonchi  ABDOMEN: Soft, non-tender, non-distended EXTREMITIES:  No edema; No deformity  ASSESSMENT AND PLAN: .   Assessment and Plan     Supraventricular Tachycardia (SVT) Intermittent SVT episodes persist despite increased metoprolol. Stress and sleep deprivation may contribute. Prefers non-pharmacological interventions. - Maintain current dose of metoprolol 37.5 mg daily.  - Focus on stress reduction and improving sleep patterns. - Ensure reliable pharmacy supply of metoprolol.  Nonobstructive Coronary Artery Disease (CAD) Nonobstructive CAD with no blockages. Blood pressure well-controlled. High physical activity level. Continued aspirin and LDL management appropriate. - Continue management with aspirin and LDL reduction.  Familial Hyperlipidemia LDL cholesterol at 68 mg/dL. Prefers Repatha over Praluent due to better tolerance. Discussed copay card for Repatha. - Check lipid levels today. - Switch from Praluent back to Repatha. - Provide copay card for Repatha. - Continue rosuvastatin 5 mg Monday through Friday and Zetia 10 mg daily. - Continue aspirin 81 mg daily.  Leg Pain Chronic leg pain likely neuropathic or musculoskeletal. Satisfied with previous evaluations, no further testing desired. - No re-evaluation needed unless symptoms change.              Follow-up: Return in about 1 year (around 11/17/2024).   Signed, Gigi Kyle. Rolm Clos, MD, [facility]  [facility]  [address] [place], [place] [address] [phone]  10:44 AM

## 2023-11-18 ENCOUNTER — Encounter: Payer: Self-pay | Admitting: Cardiovascular Disease

## 2023-11-18 ENCOUNTER — Ambulatory Visit
Payer: No Typology Code available for payment source | Attending: Cardiovascular Disease | Admitting: Cardiovascular Disease

## 2023-11-18 VITALS — BP 124/88 | HR 56 | Ht 68.0 in | Wt 149.8 lb

## 2023-11-18 DIAGNOSIS — I25118 Atherosclerotic heart disease of native coronary artery with other forms of angina pectoris: Secondary | ICD-10-CM | POA: Diagnosis not present

## 2023-11-18 DIAGNOSIS — E7801 Familial hypercholesterolemia: Secondary | ICD-10-CM

## 2023-11-18 DIAGNOSIS — I10 Essential (primary) hypertension: Secondary | ICD-10-CM | POA: Diagnosis not present

## 2023-11-18 DIAGNOSIS — R931 Abnormal findings on diagnostic imaging of heart and coronary circulation: Secondary | ICD-10-CM

## 2023-11-18 LAB — LIPID PANEL
Chol/HDL Ratio: 1.8 ratio (ref 0.0–4.4)
Cholesterol, Total: 151 mg/dL (ref 100–199)
HDL: 83 mg/dL (ref >39)
LDL Chol Calc (NIH): 55 mg/dL (ref 0–99)
Triglycerides: 64 mg/dL (ref 0–149)
VLDL Cholesterol Cal: 13 mg/dL (ref 5–40)

## 2023-11-18 MED ORDER — REPATHA SURECLICK 140 MG/ML ~~LOC~~ SOAJ: 140.0000 mg | SUBCUTANEOUS | 2 refills | Status: DC

## 2023-11-18 NOTE — Patient Instructions (Addendum)
 Medication Instructions:   - STOP PRAULENT  - START REPATHA 140MG /1ML INJECTION EVERY 14 DAYS.  Visit [email or web address] for savings card!    *If you need a refill on your cardiac medications before your next appointment, please call your pharmacy*   Lab Work: LIPID PANEL    If you have labs (blood work) drawn today and your tests are completely normal, you will receive your results only by: MyChart Message (if you have MyChart) OR A paper copy in the mail If you have any lab test that is abnormal or we need to change your treatment, we will call you to review the results.   Testing/Procedures: NONE    Follow-Up: At [facility], you and your health needs are our priority.  As part of our continuing mission to provide you with exceptional heart care, we have created designated Provider Care Teams.  These Care Teams include your primary Cardiologist (physician) and Advanced Practice Providers (APPs -  Physician Assistants and Nurse Practitioners) who all work together to provide you with the care you need, when you need it.  We recommend signing up for the patient portal called "MyChart".  Sign up information is provided on this After Visit Summary.  MyChart is used to connect with patients for Virtual Visits (Telemedicine).  Patients are able to view lab/test results, encounter notes, upcoming appointments, etc.  Non-urgent messages can be sent to your provider as well.   To learn more about what you can do with MyChart, go to [email or web address].    Your next appointment:   1 year(s)  The format for your next appointment:   In Person  Provider:   Oneil Bigness, MD    Other Instructions

## 2023-11-19 ENCOUNTER — Encounter: Payer: Self-pay | Admitting: Cardiovascular Disease

## 2024-02-13 ENCOUNTER — Other Ambulatory Visit: Payer: Self-pay | Admitting: Pharmacist

## 2024-02-13 ENCOUNTER — Telehealth: Payer: Self-pay | Admitting: Cardiovascular Disease

## 2024-02-13 DIAGNOSIS — R931 Abnormal findings on diagnostic imaging of heart and coronary circulation: Secondary | ICD-10-CM

## 2024-02-13 DIAGNOSIS — E7801 Familial hypercholesterolemia: Secondary | ICD-10-CM

## 2024-02-13 DIAGNOSIS — I25118 Atherosclerotic heart disease of native coronary artery with other forms of angina pectoris: Secondary | ICD-10-CM

## 2024-02-13 MED ORDER — REPATHA SURECLICK 140 MG/ML ~~LOC~~ SOAJ: 140.0000 mg | SUBCUTANEOUS | 1 refills | Status: DC

## 2024-02-13 NOTE — Telephone Encounter (Signed)
*  STAT* If patient is at the pharmacy, call can be transferred to refill team.   1. Which medications need to be refilled? (please list name of each medication and dose if known)   Evolocumab  (REPATHA  SURECLICK) 140 MG/ML SOAJ   2. Would you like to learn more about the convenience, safety, & potential cost savings by using the [facility]?   3. Are you open to using the [facility] (Type [facility]. ).  4. Which pharmacy/location (including street and city if local pharmacy) is medication to be sent to?  [address] [facility] [place], [place] - [address]   5. Do they need a 30 day or 90 day supply?   30 day  Caller Nadia) stated patient will need medication by Monday (7/14)

## 2024-03-11 ENCOUNTER — Telehealth: Payer: Self-pay | Admitting: Pharmacy Technician

## 2024-03-11 ENCOUNTER — Telehealth: Payer: Self-pay | Admitting: Cardiovascular Disease

## 2024-03-11 ENCOUNTER — Other Ambulatory Visit (HOSPITAL_COMMUNITY): Payer: Self-pay

## 2024-03-11 NOTE — Telephone Encounter (Signed)
 Pharmacy Patient Advocate Encounter  Received notification from [facility] that Prior Authorization for Repatha  has been APPROVED from 03/11/24 to 09/11/24. Ran test claim, Copay is $12.15- 3 months. This test claim was processed through [facility]- copay amounts may vary at other pharmacies due to pharmacy/plan contracts, or as the patient moves through the different stages of their insurance plan.   PA #/Case ID/Reference #: [id number]

## 2024-03-11 NOTE — Telephone Encounter (Signed)
 Pt c/o medication issue:  1. Name of Medication:   Evolocumab  (REPATHA  SURECLICK) 140 MG/ML SOAJ    2. How are you currently taking this medication (dosage and times per day)? As written   3. Are you having a reaction (difficulty breathing--STAT)? No   4. What is your medication issue? Pharmacy called in stating pt needs PA for this med.

## 2024-03-11 NOTE — Telephone Encounter (Signed)
 Pharmacy Patient Advocate Encounter   Received notification from Pt Calls Messages that prior authorization for REpatha  is required/requested.   Insurance verification completed.   The patient is insured through [employer] .   Per test claim: PA required; PA submitted to above mentioned insurance via CoverMyMeds Key/confirmation #/EOC [facility] Status is pending

## 2024-03-12 ENCOUNTER — Encounter: Payer: Self-pay | Admitting: Cardiovascular Disease

## 2024-03-13 ENCOUNTER — Encounter: Payer: Self-pay | Admitting: Cardiovascular Disease

## 2024-07-22 ENCOUNTER — Other Ambulatory Visit: Payer: Self-pay | Admitting: Pharmacist Clinician (PhC)/ Clinical Pharmacy Specialist

## 2024-07-22 DIAGNOSIS — R931 Abnormal findings on diagnostic imaging of heart and coronary circulation: Secondary | ICD-10-CM

## 2024-07-22 DIAGNOSIS — E78011 Heterozygous familial hypercholesterolemia (hefh): Secondary | ICD-10-CM

## 2024-07-22 DIAGNOSIS — I25118 Atherosclerotic heart disease of native coronary artery with other forms of angina pectoris: Secondary | ICD-10-CM

## 2024-07-22 MED ORDER — REPATHA SURECLICK 140 MG/ML ~~LOC~~ SOAJ: 140.0000 mg | SUBCUTANEOUS | 3 refills | Status: AC
